# Patient Record
Sex: Female | Born: 1960 | Race: Black or African American | Hispanic: No | Marital: Married | Smoking: Never smoker
Health system: Southern US, Community
[De-identification: ages and names within clinical notes are randomized; demographics above are authoritative.]

## PROBLEM LIST (undated history)

## (undated) DIAGNOSIS — D649 Anemia, unspecified: Secondary | ICD-10-CM

## (undated) DIAGNOSIS — M1990 Unspecified osteoarthritis, unspecified site: Secondary | ICD-10-CM

## (undated) DIAGNOSIS — J302 Other seasonal allergic rhinitis: Secondary | ICD-10-CM

## (undated) DIAGNOSIS — E669 Obesity, unspecified: Secondary | ICD-10-CM

## (undated) HISTORY — DX: Unspecified osteoarthritis, unspecified site: M19.90

## (undated) HISTORY — DX: Anemia, unspecified: D64.9

## (undated) HISTORY — PX: OTHER SURGICAL HISTORY: SHX169

## (undated) HISTORY — DX: Obesity, unspecified: E66.9

---

## 2000-04-26 ENCOUNTER — Emergency Department (HOSPITAL_COMMUNITY): Admission: EM | Admit: 2000-04-26 | Discharge: 2000-04-26 | Payer: Self-pay | Admitting: *Deleted

## 2000-04-26 ENCOUNTER — Encounter: Payer: Self-pay | Admitting: *Deleted

## 2000-05-04 ENCOUNTER — Ambulatory Visit (HOSPITAL_COMMUNITY): Admission: RE | Admit: 2000-05-04 | Discharge: 2000-05-04 | Payer: Self-pay | Admitting: Orthopedic Surgery

## 2000-05-04 ENCOUNTER — Encounter: Payer: Self-pay | Admitting: Orthopedic Surgery

## 2000-07-19 ENCOUNTER — Encounter (HOSPITAL_COMMUNITY): Admission: RE | Admit: 2000-07-19 | Discharge: 2000-08-10 | Payer: Self-pay | Admitting: Orthopedic Surgery

## 2003-02-28 ENCOUNTER — Emergency Department (HOSPITAL_COMMUNITY): Admission: EM | Admit: 2003-02-28 | Discharge: 2003-02-28 | Payer: Self-pay | Admitting: Emergency Medicine

## 2004-01-16 ENCOUNTER — Ambulatory Visit (HOSPITAL_COMMUNITY): Admission: RE | Admit: 2004-01-16 | Discharge: 2004-01-16 | Payer: Self-pay | Admitting: Family Medicine

## 2004-01-27 ENCOUNTER — Ambulatory Visit (HOSPITAL_COMMUNITY): Admission: RE | Admit: 2004-01-27 | Discharge: 2004-01-27 | Payer: Self-pay | Admitting: Family Medicine

## 2004-04-28 ENCOUNTER — Ambulatory Visit (HOSPITAL_COMMUNITY): Admission: RE | Admit: 2004-04-28 | Discharge: 2004-04-28 | Payer: Self-pay | Admitting: Family Medicine

## 2005-05-04 ENCOUNTER — Encounter (HOSPITAL_COMMUNITY): Admission: RE | Admit: 2005-05-04 | Discharge: 2005-06-03 | Payer: Self-pay | Admitting: Oncology

## 2005-05-04 ENCOUNTER — Encounter: Admission: RE | Admit: 2005-05-04 | Discharge: 2005-05-04 | Payer: Self-pay | Admitting: Oncology

## 2005-05-04 ENCOUNTER — Ambulatory Visit (HOSPITAL_COMMUNITY): Payer: Self-pay | Admitting: Oncology

## 2005-05-21 ENCOUNTER — Emergency Department (HOSPITAL_COMMUNITY): Admission: EM | Admit: 2005-05-21 | Discharge: 2005-05-21 | Payer: Self-pay | Admitting: Emergency Medicine

## 2005-06-14 ENCOUNTER — Encounter: Admission: RE | Admit: 2005-06-14 | Discharge: 2005-06-14 | Payer: Self-pay | Admitting: Oncology

## 2005-06-14 ENCOUNTER — Encounter (HOSPITAL_COMMUNITY): Admission: RE | Admit: 2005-06-14 | Discharge: 2005-07-14 | Payer: Self-pay | Admitting: Oncology

## 2005-06-16 ENCOUNTER — Ambulatory Visit (HOSPITAL_COMMUNITY): Admission: RE | Admit: 2005-06-16 | Discharge: 2005-06-16 | Payer: Self-pay | Admitting: Family Medicine

## 2005-06-30 ENCOUNTER — Ambulatory Visit (HOSPITAL_COMMUNITY): Admission: RE | Admit: 2005-06-30 | Discharge: 2005-06-30 | Payer: Self-pay | Admitting: Family Medicine

## 2006-02-02 ENCOUNTER — Ambulatory Visit (HOSPITAL_COMMUNITY): Admission: RE | Admit: 2006-02-02 | Discharge: 2006-02-02 | Payer: Self-pay | Admitting: Family Medicine

## 2008-05-27 ENCOUNTER — Emergency Department (HOSPITAL_COMMUNITY): Admission: EM | Admit: 2008-05-27 | Discharge: 2008-05-27 | Payer: Self-pay | Admitting: Emergency Medicine

## 2010-03-03 LAB — COMPREHENSIVE METABOLIC PANEL
ALT: 28 U/L (ref 7–35)
AST: 28 U/L
Alkaline Phosphatase: 61 U/L
BUN: 9 mg/dL (ref 4–21)
Potassium: 4.5 mmol/L
Sodium: 139 mmol/L (ref 137–147)
Total Bilirubin: 0.4 mg/dL

## 2010-03-03 LAB — CBC WITH DIFFERENTIAL/PLATELET
HCT: 37 %
Hemoglobin: 11 g/dL — AB (ref 12.0–16.0)
MCV: 73.5 fL
WBC: 7

## 2010-09-29 ENCOUNTER — Telehealth: Payer: Self-pay

## 2010-09-29 NOTE — Telephone Encounter (Signed)
LMOM for pt to call. 

## 2010-10-01 ENCOUNTER — Ambulatory Visit: Payer: Self-pay | Admitting: Urgent Care

## 2010-10-01 NOTE — Telephone Encounter (Signed)
Pt is scheduled for OV with Tana Coast at 9:00 AM on 10/05/2010.

## 2010-10-05 ENCOUNTER — Encounter: Payer: Self-pay | Admitting: Gastroenterology

## 2010-10-05 ENCOUNTER — Ambulatory Visit (INDEPENDENT_AMBULATORY_CARE_PROVIDER_SITE_OTHER): Payer: BC Managed Care – PPO | Admitting: Gastroenterology

## 2010-10-05 DIAGNOSIS — D649 Anemia, unspecified: Secondary | ICD-10-CM

## 2010-10-05 DIAGNOSIS — Z1211 Encounter for screening for malignant neoplasm of colon: Secondary | ICD-10-CM | POA: Insufficient documentation

## 2010-10-05 NOTE — Progress Notes (Signed)
Primary Care Physician:  Kirk Ruths, MD  Primary Gastroenterologist:  [name], MD   Chief Complaint  Patient presents with  . Colonoscopy    HPI:  Amy Parrish is a 50 y.o. female here to schedule colonoscopy. She gives a history of anemia previously seen by Dr. Glenford Peers. States she tried to give blood several times per hemoglobin was too low. States she was given "treatment" and that does seem to make a difference. She states she was told that it was just her "normal". She denies any blood in the stool or melena. Regular menses. Denies constipation, diarrhea, abdominal pain, heartburn, dysphagia, I doubt aphasia, weight loss. He intermittently takes over-the-counter NSAIDs for headache but nothing on a regular basis. She has never had a colonoscopy. No family members with history of colon polyps or colon cancer. She had a sister who died at age 43 of unknown etiology. Formal autopsy was not done. The family thought it may be cancer but they're not sure.  No current outpatient prescriptions on file.    Allergies as of 10/05/2010  . (No Known Allergies)    Past Medical History  Diagnosis Date  . Anemia     seen by Dr. Mariel Sleet    Past Surgical History  Procedure Date  . Left hand surgery     Family History  Problem Relation Age of Onset  . Colon cancer Neg Hx   . Hypertension Father   . Hypertension Mother   . Kidney failure Mother     early stages    History   Social History  . Marital Status: Single    Spouse Name: N/A    Number of Children: 5  . Years of Education: N/A   Occupational History  . school     nutrition   Social History Main Topics  . Smoking status: Never Smoker   . Smokeless tobacco: Not on file  . Alcohol Use: No  . Drug Use: No  . Sexually Active: Not on file   Other Topics Concern  . Not on file   Social History Narrative   1 biological children4 adopted children      ROS:  General: Negative for anorexia,  weight loss, fever, chills, fatigue, weakness. Eyes: Negative for vision changes.  ENT: Negative for hoarseness, difficulty swallowing , nasal congestion. CV: Negative for chest pain, angina, palpitations, dyspnea on exertion, peripheral edema.  Respiratory: Negative for dyspnea at rest, dyspnea on exertion, cough, sputum, wheezing.  GI: See history of present illness. GU:  Negative for dysuria, hematuria, urinary incontinence, urinary frequency, nocturnal urination.  MS: Negative for joint pain, low back pain.  Derm: Negative for rash or itching.  Neuro: Negative for weakness, abnormal sensation, seizure, frequent headaches, memory loss, confusion.  Psych: Negative for anxiety, depression, suicidal ideation, hallucinations.  Endo: Negative for unusual weight change.  Heme: Negative for bruising or bleeding. Allergy: Negative for rash or hives.    Physical Examination:  BP 121/69  Pulse 71  Temp(Src) 97.5 F (36.4 C) (Temporal)  Ht 5\' 3"  (1.6 m)  Wt 183 lb 12.8 oz (83.371 kg)  BMI 32.56 kg/m2  LMP 10/04/2010   General: Well-nourished, well-developed in no acute distress. Obese. Head: Normocephalic, atraumatic.   Eyes: Conjunctiva pink, no icterus. Mouth: Oropharyngeal mucosa moist and pink , no lesions erythema or exudate. Neck: Supple without thyromegaly, masses, or lymphadenopathy.  Lungs: Clear to auscultation bilaterally.  Heart: Regular rate and rhythm, no murmurs rubs or gallops.  Abdomen:  Bowel sounds are normal, nontender, nondistended, no hepatosplenomegaly or masses, no abdominal bruits or    hernia , no rebound or guarding.   Rectal: Deferred to time of colonoscopy. Extremities: No lower extremity edema. No clubbing or deformities.  Neuro: Alert and oriented x 4 , grossly normal neurologically.  Skin: Warm and dry, no rash or jaundice.   Psych: Alert and cooperative, normal mood and affect.

## 2010-10-05 NOTE — Assessment & Plan Note (Signed)
No prior colonoscopy. Due for average risk screening. History of anemia but further details unavailable at this time.  I have discussed the risks, alternatives, benefits with regards to but not limited to the risk of reaction to medication, bleeding, infection, perforation and the patient is agreeable to proceed. Written consent to be obtained.

## 2010-10-05 NOTE — Assessment & Plan Note (Signed)
Patient has history of anemia. Will retrieve records from Dr. Mariel Sleet and [facility] for review.

## 2010-10-05 NOTE — Progress Notes (Signed)
Cc to PCP 

## 2010-10-06 ENCOUNTER — Encounter: Payer: Self-pay | Admitting: Gastroenterology

## 2010-10-06 NOTE — Progress Notes (Signed)
Received records from Dr. Thornton Papas office. Patient seen back in 2007. 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 Hgb in 11 range with MCV in low 70s.

## 2010-10-15 MED ORDER — SODIUM CHLORIDE 0.45 % IV SOLN
Freq: Once | INTRAVENOUS | Status: AC
  Administered 2010-10-16: 11:00:00 via INTRAVENOUS

## 2010-10-16 ENCOUNTER — Other Ambulatory Visit: Payer: Self-pay | Admitting: Gastroenterology

## 2010-10-16 ENCOUNTER — Ambulatory Visit (HOSPITAL_COMMUNITY)
Admission: RE | Admit: 2010-10-16 | Discharge: 2010-10-16 | Disposition: A | Discharge status: Home / Self Care | Payer: BC Managed Care – PPO | Source: Ambulatory Visit | Attending: Gastroenterology | Admitting: Gastroenterology

## 2010-10-16 ENCOUNTER — Encounter (HOSPITAL_COMMUNITY): Payer: Self-pay | Admitting: *Deleted

## 2010-10-16 ENCOUNTER — Encounter (HOSPITAL_COMMUNITY)
Admission: RE | Disposition: A | Discharge status: Home / Self Care | Payer: Self-pay | Source: Ambulatory Visit | Attending: Gastroenterology

## 2010-10-16 DIAGNOSIS — D128 Benign neoplasm of rectum: Secondary | ICD-10-CM | POA: Insufficient documentation

## 2010-10-16 DIAGNOSIS — K5730 Diverticulosis of large intestine without perforation or abscess without bleeding: Secondary | ICD-10-CM

## 2010-10-16 DIAGNOSIS — K648 Other hemorrhoids: Secondary | ICD-10-CM

## 2010-10-16 DIAGNOSIS — Z1211 Encounter for screening for malignant neoplasm of colon: Secondary | ICD-10-CM

## 2010-10-16 DIAGNOSIS — D129 Benign neoplasm of anus and anal canal: Secondary | ICD-10-CM

## 2010-10-16 HISTORY — DX: Other seasonal allergic rhinitis: J30.2

## 2010-10-16 HISTORY — PX: COLONOSCOPY: SHX5424

## 2010-10-16 SURGERY — COLONOSCOPY
Anesthesia: Moderate Sedation

## 2010-10-16 MED ORDER — MIDAZOLAM HCL 5 MG/5ML IJ SOLN
INTRAMUSCULAR | Status: DC | PRN
  Administered 2010-10-16: 1 mg via INTRAVENOUS
  Administered 2010-10-16: 2 mg via INTRAVENOUS
  Administered 2010-10-16: 1 mg via INTRAVENOUS

## 2010-10-16 MED ORDER — MEPERIDINE HCL 100 MG/ML IJ SOLN
INTRAMUSCULAR | Status: AC
  Filled 2010-10-16: qty 2

## 2010-10-16 MED ORDER — STERILE WATER FOR IRRIGATION IR SOLN
Status: DC | PRN
  Administered 2010-10-16: 13:00:00

## 2010-10-16 MED ORDER — MIDAZOLAM HCL 5 MG/5ML IJ SOLN
INTRAMUSCULAR | Status: AC
  Filled 2010-10-16: qty 10

## 2010-10-16 MED ORDER — MEPERIDINE HCL 100 MG/ML IJ SOLN
INTRAMUSCULAR | Status: DC | PRN
  Administered 2010-10-16: 25 mg via INTRAVENOUS
  Administered 2010-10-16: 50 mg via INTRAVENOUS
  Administered 2010-10-16: 25 mg via INTRAVENOUS

## 2010-10-16 NOTE — H&P (Signed)
Reason for Visit     Colonoscopy        Vitals - Last Recorded       BP Pulse Temp(Src) Ht Wt BMI    121/69  71  97.5 F (36.4 C) (Temporal)  5\' 3"  (1.6 m)  183 lb 12.8 oz (83.371 kg)  32.56 kg/m2          LMP              10/04/2010                 Vitals History Recorded          Progress Notes     Amy Coast, PA  10/05/2010 10:26 AM  Signed Primary Care Physician:  Amy Ruths, MD   Primary Gastroenterologist:  Amy Eva, MD      Chief Complaint   Patient presents with   .  Colonoscopy      HPI:  Amy Parrish is a 50 y.o. female here to schedule colonoscopy. She gives a history of anemia previously seen by Amy Parrish. States she tried to give blood several times per hemoglobin was too low. States she was given "treatment" and that does seem to make a difference. She states she was told that it was just her "normal". She denies any blood in the stool or melena. Regular menses. Denies constipation, diarrhea, abdominal pain, heartburn, dysphagia, I doubt aphasia, weight loss. He intermittently takes over-the-counter NSAIDs for headache but nothing on a regular basis. She has never had a colonoscopy. No family members with history of colon polyps or colon cancer. She had a sister who died at age 52 of unknown etiology. Formal autopsy was not done. The family thought it may be cancer but they're not sure.    No current outpatient prescriptions on file.       Allergies as of 10/05/2010   .  (No Known Allergies)       Past Medical History   Diagnosis  Date   .  Anemia         seen by Amy Parrish       Past Surgical History   Procedure  Date   .  Left hand surgery         Family History   Problem  Relation  Age of Onset   .  Colon cancer  Neg Hx     .  Hypertension  Father     .  Hypertension  Mother     .  Kidney failure  Mother         early stages       History       Social History   .  Marital Status:  Single    Spouse Name:  N/A       Number of Children:  5   .  Years of Education:  N/A       Occupational History   .  school         nutrition       Social History Main Topics   .  Smoking status:  Never Smoker    .  Smokeless tobacco:  Not on file   .  Alcohol Use:  No   .  Drug Use:  No   .  Sexually Active:  Not on file       Other Topics  Concern   .  Not on file  Social History Narrative     1 biological children4 adopted children        ROS:   General: Negative for anorexia, weight loss, fever, chills, fatigue, weakness. Eyes: Negative for vision changes.   ENT: Negative for hoarseness, difficulty swallowing , nasal congestion. CV: Negative for chest pain, angina, palpitations, dyspnea on exertion, peripheral edema.   Respiratory: Negative for dyspnea at rest, dyspnea on exertion, cough, sputum, wheezing.   GI: See history of present illness. GU:  Negative for dysuria, hematuria, urinary incontinence, urinary frequency, nocturnal urination.   MS: Negative for joint pain, low back pain.   Derm: Negative for rash or itching.   Neuro: Negative for weakness, abnormal sensation, seizure, frequent headaches, memory loss, confusion.   Psych: Negative for anxiety, depression, suicidal ideation, hallucinations.   Endo: Negative for unusual weight change.   Heme: Negative for bruising or bleeding. Allergy: Negative for rash or hives.     Physical Examination:   BP 121/69  Pulse 71  Temp(Src) 97.5 F (36.4 C) (Temporal)  Ht 5\' 3"  (1.6 m)  Wt 183 lb 12.8 oz (83.371 kg)  BMI 32.56 kg/m2  LMP 10/04/2010    General: Well-nourished, well-developed in no acute distress. Obese. Head: Normocephalic, atraumatic.    Eyes: Conjunctiva pink, no icterus. Mouth: Oropharyngeal mucosa moist and pink , no lesions erythema or exudate. Neck: Supple without thyromegaly, masses, or lymphadenopathy.   Lungs: Clear to auscultation bilaterally.   Heart: Regular rate and rhythm, no  murmurs rubs or gallops.   Abdomen: Bowel sounds are normal, nontender, nondistended, no hepatosplenomegaly or masses, no abdominal bruits or    hernia , no rebound or guarding.    Rectal: Deferred to time of colonoscopy. Extremities: No lower extremity edema. No clubbing or deformities.   Neuro: Alert and oriented x 4 , grossly normal neurologically.   Skin: Warm and dry, no rash or jaundice.    Psych: Alert and cooperative, normal mood and affect.    Amy Parrish  10/05/2010  4:19 PM  Signed Cc to PCP  Amy Coast, PA  10/06/2010  2:37 PM  Signed Received records from Dr. Thornton Parrish office. Patient seen back in 2007. 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 Hgb in 11 range with MCV in low 70s.          Anemia - Amy Coast, PA  10/05/2010  9:36 AM  Signed Patient has history of anemia. Will retrieve records from Amy Parrish and [facility] for review.  Colon cancer screening - Amy Coast, PA  10/05/2010  9:36 AM  Signed No prior colonoscopy. Due for average risk screening. History of anemia but further details unavailable at this time.  I have discussed the risks, alternatives, benefits with regards to but not limited to the risk of reaction to medication, bleeding, infection, perforation and the patient is agreeable to proceed. Written consent to be obtained.

## 2010-10-16 NOTE — Interval H&P Note (Signed)
History and Physical Interval Note:   10/16/2010   12:58 PM   Amy Parrish  has presented today for surgery, with the diagnosis of hx of anemia  The various methods of treatment have been discussed with the patient and family. After consideration of risks, benefits and other options for treatment, the patient has consented to  Procedure(s): COLONOSCOPY as a surgical intervention .  I have reviewed the patients' chart and labs.  Questions were answered to the patient's satisfaction.     [name]  MD

## 2010-10-21 ENCOUNTER — Telehealth: Payer: Self-pay | Admitting: Gastroenterology

## 2010-10-21 NOTE — Telephone Encounter (Signed)
Please call pt. 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 TCS in 10 years. High fiber diet.

## 2010-10-21 NOTE — Telephone Encounter (Signed)
Reminder in epic to have tcs in 10 years 

## 2010-10-22 NOTE — Telephone Encounter (Signed)
LMOM to call.

## 2010-10-22 NOTE — Telephone Encounter (Signed)
Cc Results to PCP 

## 2010-10-26 ENCOUNTER — Encounter (HOSPITAL_COMMUNITY): Payer: Self-pay | Admitting: Gastroenterology

## 2010-10-27 NOTE — Telephone Encounter (Signed)
LMOM to call.

## 2010-10-29 NOTE — Telephone Encounter (Signed)
LMOM to call.

## 2010-10-29 NOTE — Telephone Encounter (Signed)
Letter mailed to pt to call.  

## 2010-11-03 NOTE — Progress Notes (Signed)
Pt called to get results of biopsy from 10/21/2010. Informed of all.

## 2010-12-07 NOTE — Progress Notes (Signed)
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

## 2010-12-16 ENCOUNTER — Encounter (HOSPITAL_COMMUNITY): Payer: Self-pay | Admitting: Emergency Medicine

## 2010-12-16 ENCOUNTER — Emergency Department (HOSPITAL_COMMUNITY)
Admission: EM | Admit: 2010-12-16 | Discharge: 2010-12-17 | Disposition: A | Discharge status: Home / Self Care | Payer: BC Managed Care – PPO | Attending: Emergency Medicine | Admitting: Emergency Medicine

## 2010-12-16 DIAGNOSIS — Z9181 History of falling: Secondary | ICD-10-CM | POA: Insufficient documentation

## 2010-12-16 DIAGNOSIS — M79609 Pain in unspecified limb: Secondary | ICD-10-CM | POA: Insufficient documentation

## 2010-12-16 DIAGNOSIS — M79673 Pain in unspecified foot: Secondary | ICD-10-CM

## 2010-12-16 DIAGNOSIS — D563 Thalassemia minor: Secondary | ICD-10-CM | POA: Insufficient documentation

## 2010-12-16 DIAGNOSIS — Z862 Personal history of diseases of the blood and blood-forming organs and certain disorders involving the immune mechanism: Secondary | ICD-10-CM | POA: Insufficient documentation

## 2010-12-16 NOTE — ED Notes (Signed)
Patient states she fell at work on 11/23/2010 and hurt her foot. States continues to have pain in right foot since fall; states has been unable to get in touch with her employee health.

## 2010-12-17 ENCOUNTER — Emergency Department (HOSPITAL_COMMUNITY): Payer: BC Managed Care – PPO

## 2010-12-17 MED ORDER — OXYCODONE-ACETAMINOPHEN 5-325 MG PO TABS
2.0000 | ORAL_TABLET | Freq: Once | ORAL | Status: AC
  Administered 2010-12-17: 2 via ORAL
  Filled 2010-12-17: qty 2

## 2010-12-17 MED ORDER — HYDROCODONE-ACETAMINOPHEN 5-325 MG PO TABS: 2.0000 | ORAL_TABLET | ORAL | Status: AC | PRN

## 2010-12-17 NOTE — ED Provider Notes (Signed)
History     CSN: [id number] Arrival date & time: 12/16/2010 11:50 PM   First MD Initiated Contact with Patient 12/17/10 0153      Chief Complaint  Patient presents with  . Foot Pain    (Consider location/radiation/quality/duration/timing/severity/associated sxs/prior treatment) HPI Comments: Patient follow work November 12 and injured her right lateral foot. She's had continued pain since the fall and is unable to follow up with occupational health. She denies any new injury, weakness, numbness or tingling. Denies any fever or vomiting. He says the x-ray to November and was negative for fracture. She still has pain over her right lateral foot that radiates up into her ankle. She was taking Naprosyn for pain but ran out.  The history is provided by the patient.    Past Medical History  Diagnosis Date  . Anemia     seen by Dr. Mariel Sleet, records alpha thalassemia trait  . Seasonal allergies     Past Surgical History  Procedure Date  . Left hand surgery   . Colonoscopy 10/16/2010    Procedure: COLONOSCOPY;  Surgeon: Arlyce Harman, MD;  Location: [facility];  Service: Endoscopy;  Laterality: N/A;  11:30    Family History  Problem Relation Age of Onset  . Colon cancer Neg Hx   . Hypertension Father   . Hypertension Mother   . Kidney failure Mother     early stages    History  Substance Use Topics  . Smoking status: Never Smoker   . Smokeless tobacco: Not on file  . Alcohol Use: No    OB History    Grav Para Term Preterm Abortions TAB SAB Ect Mult Living                  Review of Systems  All other systems reviewed and are negative.    Allergies  Bee venom  Home Medications   Current Outpatient Rx  Name Route Sig Dispense Refill  . NAPROXEN 500 MG PO TABS Oral Take 500 mg by mouth 2 (two) times daily.      Marland Kitchen EPINEPHRINE 0.3 MG/0.3ML IJ DEVI Intramuscular Inject 0.3 mg into the muscle once.      Marland Kitchen HYDROCODONE-ACETAMINOPHEN 5-325 MG PO TABS Oral Take 2  tablets by mouth every 4 (four) hours as needed for pain. 10 tablet 0    BP 134/63  Pulse 65  Temp(Src) 98 F (36.7 C) (Oral)  Resp 20  Ht 5\' 3"  (1.6 m)  Wt 175 lb (79.379 kg)  BMI 31.00 kg/m2  SpO2 98%  LMP 12/16/2010  Physical Exam  Constitutional: She is oriented to person, place, and time. She appears well-developed and well-nourished. No distress.  HENT:  Head: Normocephalic and atraumatic.  Mouth/Throat: Oropharynx is clear and moist.  Eyes: Conjunctivae are normal.  Neck: Normal range of motion.  Cardiovascular: Normal rate, regular rhythm and normal heart sounds.   Pulmonary/Chest: Effort normal and breath sounds normal. No respiratory distress.  Abdominal: Soft. There is no tenderness. There is no rebound and no guarding.  Musculoskeletal: She exhibits tenderness.       Tenderness to palpation over the right lateral foot. No specific pain at the lateral malleolus or base of fifth metatarsal. 2 DP and PT pulses able to wiggle toes  Neurological: She is alert and oriented to person, place, and time.  Skin: Skin is warm.    ED Course  Procedures (including critical care time)  Labs Reviewed - No data to display Dg  Foot Complete Right  12/17/2010  *RADIOLOGY REPORT*  Clinical Data: Status post fall; injury to right foot, with persistent right foot pain.  RIGHT FOOT COMPLETE - 3+ VIEW  Comparison: Right foot radiographs performed 11/27/2010  Findings: There is no evidence of fracture or dislocation.  The joint spaces are preserved.  There is no evidence of talar subluxation; the subtalar joint is unremarkable in appearance. Mild flattening and subcortical cyst formation along the medial aspect of the first metatarsal head are stable in appearance. Plantar and posterior calcaneal spurs are incidentally noted.  No significant soft tissue abnormalities are seen.  IMPRESSION:  1.  No evidence of fracture or dislocation. 2.  Stable mild degenerative change at the medial aspect of  the first metatarsal head.  Original Report Authenticated By: Tonia Ghent, M.D.     1. Foot pain       MDM   foot pain after remote fall.  Neurovascularly intact without any new injury.  We'll treat pain, immobilize and followup with occupational health.       Glynn Octave, MD 12/17/10 [phone]

## 2010-12-17 NOTE — ED Notes (Signed)
Pt reports falling while at work on November 23, 2010 and sprained my ankle. Pt states "I was told my case worker went out on leave and there's nobody to follow my case. My ankle is still hurting me and it's not getting any better. I can walk on it but it's really painful." Pt denies new injury.

## 2010-12-28 ENCOUNTER — Other Ambulatory Visit (HOSPITAL_COMMUNITY): Payer: Self-pay | Admitting: Family Medicine

## 2010-12-28 ENCOUNTER — Other Ambulatory Visit (HOSPITAL_COMMUNITY): Payer: Self-pay | Admitting: *Deleted

## 2010-12-28 DIAGNOSIS — T148XXA Other injury of unspecified body region, initial encounter: Secondary | ICD-10-CM

## 2011-01-04 ENCOUNTER — Encounter (HOSPITAL_COMMUNITY)
Admission: RE | Admit: 2011-01-04 | Discharge: 2011-01-04 | Disposition: A | Discharge status: Home / Self Care | Payer: BC Managed Care – PPO | Source: Ambulatory Visit | Attending: Family Medicine | Admitting: Family Medicine

## 2011-01-04 DIAGNOSIS — T148XXA Other injury of unspecified body region, initial encounter: Secondary | ICD-10-CM | POA: Insufficient documentation

## 2011-01-04 DIAGNOSIS — M79609 Pain in unspecified limb: Secondary | ICD-10-CM | POA: Insufficient documentation

## 2011-01-04 DIAGNOSIS — X58XXXA Exposure to other specified factors, initial encounter: Secondary | ICD-10-CM | POA: Insufficient documentation

## 2011-01-04 MED ORDER — TECHNETIUM TC 99M MEDRONATE IV KIT
23.1000 | PACK | Freq: Once | INTRAVENOUS | Status: AC | PRN
  Administered 2011-01-04: 23.1 via INTRAVENOUS

## 2012-05-18 ENCOUNTER — Other Ambulatory Visit (HOSPITAL_COMMUNITY): Payer: Self-pay | Admitting: Physician Assistant

## 2012-05-18 ENCOUNTER — Ambulatory Visit (HOSPITAL_COMMUNITY)
Admission: RE | Admit: 2012-05-18 | Discharge: 2012-05-18 | Disposition: A | Discharge status: Home / Self Care | Payer: BC Managed Care – PPO | Source: Ambulatory Visit | Attending: Physician Assistant | Admitting: Physician Assistant

## 2012-05-18 DIAGNOSIS — Z0000 Encounter for general adult medical examination without abnormal findings: Secondary | ICD-10-CM

## 2012-05-18 DIAGNOSIS — M25552 Pain in left hip: Secondary | ICD-10-CM

## 2012-05-18 DIAGNOSIS — M25559 Pain in unspecified hip: Secondary | ICD-10-CM | POA: Insufficient documentation

## 2012-05-29 ENCOUNTER — Ambulatory Visit (HOSPITAL_COMMUNITY)
Admission: RE | Admit: 2012-05-29 | Discharge: 2012-05-29 | Disposition: A | Discharge status: Home / Self Care | Payer: BC Managed Care – PPO | Source: Ambulatory Visit | Attending: Physician Assistant | Admitting: Physician Assistant

## 2012-05-29 DIAGNOSIS — Z0000 Encounter for general adult medical examination without abnormal findings: Secondary | ICD-10-CM

## 2012-05-29 DIAGNOSIS — Z1231 Encounter for screening mammogram for malignant neoplasm of breast: Secondary | ICD-10-CM | POA: Insufficient documentation

## 2012-06-01 ENCOUNTER — Other Ambulatory Visit (HOSPITAL_COMMUNITY): Payer: Self-pay | Admitting: Physician Assistant

## 2012-06-01 DIAGNOSIS — Z09 Encounter for follow-up examination after completed treatment for conditions other than malignant neoplasm: Secondary | ICD-10-CM

## 2012-06-14 ENCOUNTER — Ambulatory Visit (HOSPITAL_COMMUNITY)
Admission: RE | Admit: 2012-06-14 | Discharge: 2012-06-14 | Disposition: A | Discharge status: Home / Self Care | Payer: BC Managed Care – PPO | Source: Ambulatory Visit | Attending: Physician Assistant | Admitting: Physician Assistant

## 2012-06-14 DIAGNOSIS — R928 Other abnormal and inconclusive findings on diagnostic imaging of breast: Secondary | ICD-10-CM | POA: Insufficient documentation

## 2012-06-14 DIAGNOSIS — Z09 Encounter for follow-up examination after completed treatment for conditions other than malignant neoplasm: Secondary | ICD-10-CM

## 2017-06-05 ENCOUNTER — Emergency Department (HOSPITAL_COMMUNITY): Payer: BC Managed Care – PPO

## 2017-06-05 ENCOUNTER — Encounter (HOSPITAL_COMMUNITY): Payer: Self-pay

## 2017-06-05 ENCOUNTER — Other Ambulatory Visit: Payer: Self-pay

## 2017-06-05 ENCOUNTER — Emergency Department (HOSPITAL_COMMUNITY)
Admission: EM | Admit: 2017-06-05 | Discharge: 2017-06-05 | Disposition: A | Discharge status: Home / Self Care | Payer: BC Managed Care – PPO | Source: Home / Self Care | Attending: Emergency Medicine | Admitting: Emergency Medicine

## 2017-06-05 DIAGNOSIS — R109 Unspecified abdominal pain: Secondary | ICD-10-CM

## 2017-06-05 DIAGNOSIS — K819 Cholecystitis, unspecified: Secondary | ICD-10-CM | POA: Diagnosis not present

## 2017-06-05 DIAGNOSIS — K8000 Calculus of gallbladder with acute cholecystitis without obstruction: Secondary | ICD-10-CM | POA: Diagnosis not present

## 2017-06-05 DIAGNOSIS — R112 Nausea with vomiting, unspecified: Secondary | ICD-10-CM | POA: Insufficient documentation

## 2017-06-05 LAB — CBC
HCT: 37.8 % (ref 36.0–46.0)
Hemoglobin: 11.4 g/dL — ABNORMAL LOW (ref 12.0–15.0)
MCH: 22.1 pg — AB (ref 26.0–34.0)
MCHC: 30.2 g/dL (ref 30.0–36.0)
MCV: 73.4 fL — AB (ref 78.0–100.0)
PLATELETS: 347 10*3/uL (ref 150–400)
RBC: 5.15 MIL/uL — ABNORMAL HIGH (ref 3.87–5.11)
RDW: 13.5 % (ref 11.5–15.5)
WBC: 7.9 10*3/uL (ref 4.0–10.5)

## 2017-06-05 LAB — COMPREHENSIVE METABOLIC PANEL
ALT: 23 U/L (ref 14–54)
AST: 23 U/L (ref 15–41)
Albumin: 4.6 g/dL (ref 3.5–5.0)
Alkaline Phosphatase: 84 U/L (ref 38–126)
Anion gap: 9 (ref 5–15)
BUN: 17 mg/dL (ref 6–20)
CHLORIDE: 103 mmol/L (ref 101–111)
CO2: 25 mmol/L (ref 22–32)
CREATININE: 0.7 mg/dL (ref 0.44–1.00)
Calcium: 9.1 mg/dL (ref 8.9–10.3)
GFR calc Af Amer: >60 mL/min (ref >60)
GFR calc non Af Amer: >60 mL/min (ref >60)
GLUCOSE: 127 mg/dL — AB (ref 65–99)
Potassium: 3.6 mmol/L (ref 3.5–5.1)
SODIUM: 137 mmol/L (ref 135–145)
Total Bilirubin: 0.6 mg/dL (ref 0.3–1.2)
Total Protein: 8.3 g/dL — ABNORMAL HIGH (ref 6.5–8.1)

## 2017-06-05 LAB — URINALYSIS, ROUTINE W REFLEX MICROSCOPIC
BILIRUBIN URINE: NEGATIVE
Glucose, UA: NEGATIVE mg/dL
Ketones, ur: NEGATIVE mg/dL
Leukocytes, UA: NEGATIVE
Nitrite: NEGATIVE
Protein, ur: NEGATIVE mg/dL
SPECIFIC GRAVITY, URINE: 1.018 (ref 1.005–1.030)
pH: 7 (ref 5.0–8.0)

## 2017-06-05 LAB — LIPASE, BLOOD: LIPASE: 30 U/L (ref 11–51)

## 2017-06-05 MED ORDER — ONDANSETRON 4 MG PO TBDP: 4.0000 mg | ORAL_TABLET | Freq: Three times a day (TID) | ORAL | 0 refills | Status: DC | PRN

## 2017-06-05 MED ORDER — SODIUM CHLORIDE 0.9 % IV BOLUS
1000.0000 mL | Freq: Once | INTRAVENOUS | Status: AC
  Administered 2017-06-05: 1000 mL via INTRAVENOUS

## 2017-06-05 MED ORDER — IOPAMIDOL (ISOVUE-300) INJECTION 61%
100.0000 mL | Freq: Once | INTRAVENOUS | Status: AC | PRN
  Administered 2017-06-05: 100 mL via INTRAVENOUS

## 2017-06-05 MED ORDER — ONDANSETRON 4 MG PO TBDP
4.0000 mg | ORAL_TABLET | Freq: Once | ORAL | Status: DC | PRN
  Filled 2017-06-05: qty 1

## 2017-06-05 MED ORDER — ONDANSETRON HCL 4 MG/2ML IJ SOLN
INTRAMUSCULAR | Status: AC
  Administered 2017-06-05: 4 mg via INTRAVENOUS
  Filled 2017-06-05: qty 2

## 2017-06-05 MED ORDER — FENTANYL CITRATE (PF) 100 MCG/2ML IJ SOLN
50.0000 ug | Freq: Once | INTRAMUSCULAR | Status: AC
  Administered 2017-06-05: 50 ug via INTRAVENOUS
  Filled 2017-06-05: qty 2

## 2017-06-05 MED ORDER — KETOROLAC TROMETHAMINE 30 MG/ML IJ SOLN
30.0000 mg | Freq: Once | INTRAMUSCULAR | Status: AC
  Administered 2017-06-05: 30 mg via INTRAVENOUS
  Filled 2017-06-05: qty 1

## 2017-06-05 MED ORDER — ONDANSETRON HCL 4 MG/2ML IJ SOLN
4.0000 mg | Freq: Once | INTRAMUSCULAR | Status: AC | PRN
  Administered 2017-06-05: 4 mg via INTRAVENOUS

## 2017-06-05 NOTE — ED Notes (Signed)
Patient transported to CT 

## 2017-06-05 NOTE — ED Notes (Signed)
EDP at bedside  

## 2017-06-05 NOTE — ED Triage Notes (Signed)
Pt reports abdominal pain that radiates through to back. Pt also reports N/V, all symptoms started this morning.

## 2017-06-05 NOTE — ED Notes (Signed)
Pt returned from CT. Pt reports pain is increasing.

## 2017-06-05 NOTE — ED Notes (Signed)
EDP at bedside updating patient and family. 

## 2017-06-05 NOTE — ED Provider Notes (Signed)
[facility] EMERGENCY DEPARTMENT Provider Note   CSN: [id number] Arrival date & time: 06/05/17  0550     History   Chief Complaint Chief Complaint  Patient presents with  . Abdominal Pain    N/V    HPI Amy Parrish is a 57 y.o. female.  HPI  The tp is a 57 y/o female -[place]entz to the hospital today with a complaint of abdominal pain.  This started at approximately 4:00 in the morning, when she woke up it went to her bilateral sides and back and has been very uncomfortable and colicky in nature.  She has had multiple episodes of vomiting with this and states that she vomited and dry heaves so hard she eventually saw a small amount of blood.  She denies any fevers chills diarrhea constipation or blood in her stools.  She is accompanied by her significant other who states that they ate similar food yesterday except for a turnip salad which she ate alone.  She has never had abdominal surgery, never had ovarian cyst, has never had cesarean sections or bowel obstructions.  She denies any significant obstruction or distention of her abdomen.  She has had no medication prior to arrival  Past Medical History:  Diagnosis Date  . Anemia    seen by Dr. Tressie Stalker, records alpha thalassemia trait  . Seasonal allergies     Patient Active Problem List   Diagnosis Date Noted  . Anemia 10/05/2010  . Colon cancer screening 10/05/2010    Past Surgical History:  Procedure Laterality Date  . COLONOSCOPY  10/16/2010   Procedure: COLONOSCOPY;  Surgeon: Dorothyann Peng, MD;  [facility]: [facility];  Service: Endoscopy;  Laterality: N/A;  11:30  . left hand surgery       OB History   None      Home Medications    Prior to Admission medications   Medication Sig Start Date End Date Taking? Authorizing Provider  EPINEPHrine (EPIPEN) 0.3 mg/0.3 mL DEVI Inject 0.3 mg into the muscle once.      [provider]  ondansetron (ZOFRAN ODT) 4 MG disintegrating tablet Take 1 tablet (4 mg  total) by mouth every 8 (eight) hours as needed for nausea. 06/05/17   Noemi Chapel, MD    Family History Family History  Problem Relation Age of Onset  . Hypertension Father   . Hypertension Mother   . Kidney failure Mother        early stages  . Colon cancer Neg Hx     Social History Social History   Tobacco Use  . Smoking status: Never Smoker  . Smokeless tobacco: Never Used  Substance Use Topics  . Alcohol use: No  . Drug use: No     Allergies   Bee venom   Review of Systems Review of Systems  All other systems reviewed and are negative.    Physical Exam Updated Vital Signs BP (!) 152/77   Pulse 71   Temp 98 F (36.7 C) (Oral)   Resp 20   Ht 5\' 2"  (1.575 m)   Wt 81.6 kg (180 lb)   LMP 11/19/2011   SpO2 92%   BMI 32.92 kg/m   Physical Exam  Constitutional: She appears well-developed and well-nourished. No distress.  The patient is not distressed, she is appearing uncomfortable and somewhat colicky on the bed she cannot get a comfortable position  HENT:  Head: Normocephalic and atraumatic.  Mouth/Throat: Oropharynx is clear and moist. No oropharyngeal exudate.  Eyes: Pupils are equal, round, and reactive to light. Conjunctivae and EOM are normal. Right eye exhibits no discharge. Left eye exhibits no discharge. No scleral icterus.  Neck: Normal range of motion. Neck supple. No JVD present. No thyromegaly present.  Cardiovascular: Normal rate, regular rhythm, normal heart sounds and intact distal pulses. Exam reveals no gallop and no friction rub.  No murmur heard. Pulmonary/Chest: Effort normal and breath sounds normal. No respiratory distress. She has no wheezes. She has no rales.  Abdominal: Soft. Bowel sounds are normal. She exhibits no distension and no mass. There is no tenderness.  The abdomen is obese but extremely soft and nontender diffusely  Musculoskeletal: Normal range of motion. She exhibits no edema or tenderness.  Lymphadenopathy:    She  has no cervical adenopathy.  Neurological: She is alert. Coordination normal.  Skin: Skin is warm and dry. No rash noted. No erythema.  Psychiatric: She has a normal mood and affect. Her behavior is normal.  Nursing note and vitals reviewed.    ED Treatments / Results  Labs (all labs ordered are listed, but only abnormal results are displayed) Labs Reviewed  COMPREHENSIVE METABOLIC PANEL - Abnormal; Notable for the following components:      Result Value   Glucose, Bld 127 (*)    Total Protein 8.3 (*)    All other components within normal limits  CBC - Abnormal; Notable for the following components:   RBC 5.15 (*)    Hemoglobin 11.4 (*)    MCV 73.4 (*)    MCH 22.1 (*)    All other components within normal limits  URINALYSIS, ROUTINE W REFLEX MICROSCOPIC - Abnormal; Notable for the following components:   APPearance CLOUDY (*)    Hgb urine dipstick SMALL (*)    Bacteria, UA RARE (*)    All other components within normal limits  LIPASE, BLOOD    EKG None  Radiology Ct Abdomen Pelvis W Contrast  Result Date: 06/05/2017 CLINICAL DATA:  Acute generalized abdominal pain, nausea, vomiting. EXAM: CT ABDOMEN AND PELVIS WITH CONTRAST TECHNIQUE: Multidetector CT imaging of the abdomen and pelvis was performed using the standard protocol following bolus administration of intravenous contrast. CONTRAST:  164mL ISOVUE-300 IOPAMIDOL (ISOVUE-300) INJECTION 61% COMPARISON:  None. FINDINGS: Lower chest: No acute abnormality. Hepatobiliary: Cholelithiasis without inflammation. Liver appears normal. No biliary dilatation is noted. Pancreas: Unremarkable. No pancreatic ductal dilatation or surrounding inflammatory changes. Spleen: Normal in size without focal abnormality. Adrenals/Urinary Tract: Adrenal glands appear normal. Small right renal cyst is noted. No hydronephrosis or renal obstruction is noted. No renal or ureteral calculi are noted. Urinary bladder is unremarkable. Stomach/Bowel: Stomach  is within normal limits. Appendix appears normal. No evidence of bowel wall thickening, distention, or inflammatory changes. Vascular/Lymphatic: No significant vascular findings are present. No enlarged abdominal or pelvic lymph nodes. Reproductive: Intrauterine device is noted. No adnexal abnormality is noted. Other: No abdominal wall hernia or abnormality. No abdominopelvic ascites. Musculoskeletal: No acute or significant osseous findings. IMPRESSION: Cholelithiasis. No other abnormality seen in the abdomen or pelvis. Electronically Signed   By: Marijo Conception, M.D.   On: 06/05/2017 08:49    Procedures Procedures (including critical care time)  Medications Ordered in ED Medications  ondansetron (ZOFRAN-ODT) disintegrating tablet 4 mg (4 mg Oral Not Given 06/05/17 0628)  ondansetron (ZOFRAN) injection 4 mg (4 mg Intravenous Given 06/05/17 0641)  sodium chloride 0.9 % bolus 1,000 mL (0 mLs Intravenous Stopped 06/05/17 0859)  fentaNYL (SUBLIMAZE) injection 50 mcg (50  mcg Intravenous Given 06/05/17 0722)  iopamidol (ISOVUE-300) 61 % injection 100 mL (100 mLs Intravenous Contrast Given 06/05/17 0816)  ketorolac (TORADOL) 30 MG/ML injection 30 mg (30 mg Intravenous Given 06/05/17 0849)     Initial Impression / Assessment and Plan / ED Course  I have reviewed the triage vital signs and the nursing notes.  Pertinent labs & imaging results that were available during my care of the patient were reviewed by me and considered in my medical decision making (see chart for details).    Labs reviewed, white blood cell count is normal at 7900 with no significant anemia (she does have a history of anemia requiring iron transfusions in the past).  She was given Zofran, I will add some fluids and fentanyl pending other labs.  She may need a CT scan to look for kidney stones or other sources of her significant acute onset of symptoms.  He is not in atrial fibrillation and does not have pain out of proportion to  exam  CT scan and labs are unremarkable including the urinalysis lipase metabolic panel and CBC.  CT scan shows no acute findings other than some gallstones.  Ultrasound arranged for the morning.  Patient agreeable to the plan and well-appearing at discharge  Final Clinical Impressions(s) / ED Diagnoses   Final diagnoses:  Non-intractable vomiting with nausea, unspecified vomiting type  Abdominal pain, unspecified abdominal location    ED Discharge Orders        Ordered    ondansetron (ZOFRAN ODT) 4 MG disintegrating tablet  Every 8 hours PRN     06/05/17 1030    US Abdomen Limited RUQ/Gall Gladder     06/05/17 1032       Noemi Chapel, MD 06/05/17 [phone]

## 2017-06-05 NOTE — Discharge Instructions (Addendum)
Your testing including your x-rays and blood work was normal, there was no signs of significant abnormalities other than the presence of some gallstones which at this time do not appear to be causing problems Please take Zofran as needed for nausea Clear liquid diet for the next 24 hours Return to the emergency department for severe or worsening pain or vomiting. I have arranged for you to have an ultrasound tomorrow morning.  Please return to the emergency department to check in for your ultrasound.  You do not need to register as a patient.  You will need to call the phone number on the paperwork to arrange the time for tomorrow morning.

## 2017-06-06 ENCOUNTER — Inpatient Hospital Stay (HOSPITAL_COMMUNITY)
Admission: EM | Admit: 2017-06-06 | Discharge: 2017-06-08 | DRG: 419 | Disposition: A | Discharge status: Home / Self Care | Payer: BC Managed Care – PPO | Attending: Family Medicine | Admitting: Family Medicine

## 2017-06-06 ENCOUNTER — Other Ambulatory Visit: Payer: Self-pay

## 2017-06-06 ENCOUNTER — Inpatient Hospital Stay (HOSPITAL_COMMUNITY): Payer: BC Managed Care – PPO

## 2017-06-06 ENCOUNTER — Encounter (HOSPITAL_COMMUNITY): Payer: Self-pay | Admitting: *Deleted

## 2017-06-06 DIAGNOSIS — Z91030 Bee allergy status: Secondary | ICD-10-CM | POA: Diagnosis not present

## 2017-06-06 DIAGNOSIS — K8020 Calculus of gallbladder without cholecystitis without obstruction: Secondary | ICD-10-CM | POA: Diagnosis not present

## 2017-06-06 DIAGNOSIS — D563 Thalassemia minor: Secondary | ICD-10-CM | POA: Diagnosis present

## 2017-06-06 DIAGNOSIS — K8000 Calculus of gallbladder with acute cholecystitis without obstruction: Secondary | ICD-10-CM | POA: Diagnosis present

## 2017-06-06 DIAGNOSIS — K819 Cholecystitis, unspecified: Secondary | ICD-10-CM | POA: Diagnosis present

## 2017-06-06 DIAGNOSIS — D649 Anemia, unspecified: Secondary | ICD-10-CM | POA: Diagnosis present

## 2017-06-06 LAB — COMPREHENSIVE METABOLIC PANEL
ALBUMIN: 4.5 g/dL (ref 3.5–5.0)
ALK PHOS: 70 U/L (ref 38–126)
ALT: 24 U/L (ref 14–54)
AST: 25 U/L (ref 15–41)
Anion gap: 10 (ref 5–15)
BUN: 9 mg/dL (ref 6–20)
CO2: 26 mmol/L (ref 22–32)
Calcium: 9.5 mg/dL (ref 8.9–10.3)
Chloride: 103 mmol/L (ref 101–111)
Creatinine, Ser: 0.79 mg/dL (ref 0.44–1.00)
GFR calc Af Amer: >60 mL/min (ref >60)
GFR calc non Af Amer: >60 mL/min (ref >60)
Glucose, Bld: 121 mg/dL — ABNORMAL HIGH (ref 65–99)
Potassium: 3.7 mmol/L (ref 3.5–5.1)
Sodium: 139 mmol/L (ref 135–145)
TOTAL PROTEIN: 8.1 g/dL (ref 6.5–8.1)
Total Bilirubin: 1.4 mg/dL — ABNORMAL HIGH (ref 0.3–1.2)

## 2017-06-06 LAB — CBC WITH DIFFERENTIAL/PLATELET
BASOS ABS: 0 10*3/uL (ref 0.0–0.1)
BASOS PCT: 0 %
Eosinophils Absolute: 0.1 10*3/uL (ref 0.0–0.7)
Eosinophils Relative: 1 %
HCT: 37.1 % (ref 36.0–46.0)
HEMOGLOBIN: 11.4 g/dL — AB (ref 12.0–15.0)
Lymphocytes Relative: 14 %
Lymphs Abs: 1.5 10*3/uL (ref 0.7–4.0)
MCH: 22.3 pg — ABNORMAL LOW (ref 26.0–34.0)
MCHC: 30.7 g/dL (ref 30.0–36.0)
MCV: 72.5 fL — ABNORMAL LOW (ref 78.0–100.0)
Monocytes Absolute: 0.5 10*3/uL (ref 0.1–1.0)
Monocytes Relative: 4 %
NEUTROS ABS: 8.8 10*3/uL — AB (ref 1.7–7.7)
NEUTROS PCT: 81 %
Platelets: 364 10*3/uL (ref 150–400)
RBC: 5.12 MIL/uL — ABNORMAL HIGH (ref 3.87–5.11)
RDW: 13.6 % (ref 11.5–15.5)
WBC: 10.9 10*3/uL — AB (ref 4.0–10.5)

## 2017-06-06 LAB — LIPASE, BLOOD: LIPASE: 25 U/L (ref 11–51)

## 2017-06-06 LAB — SURGICAL PCR SCREEN
MRSA, PCR: NEGATIVE
STAPHYLOCOCCUS AUREUS: NEGATIVE

## 2017-06-06 MED ORDER — ONDANSETRON HCL 4 MG PO TABS: 4.0000 mg | ORAL_TABLET | Freq: Four times a day (QID) | ORAL | Status: DC | PRN

## 2017-06-06 MED ORDER — ENOXAPARIN SODIUM 40 MG/0.4ML ~~LOC~~ SOLN
40.0000 mg | Freq: Once | SUBCUTANEOUS | Status: AC
  Administered 2017-06-07: 40 mg via SUBCUTANEOUS
  Filled 2017-06-06: qty 0.4

## 2017-06-06 MED ORDER — ONDANSETRON HCL 4 MG/2ML IJ SOLN: 4.0000 mg | Freq: Four times a day (QID) | INTRAMUSCULAR | Status: DC | PRN

## 2017-06-06 MED ORDER — HYDROMORPHONE HCL 1 MG/ML IJ SOLN
1.0000 mg | INTRAMUSCULAR | Status: DC | PRN
  Administered 2017-06-06 – 2017-06-07 (×6): 1 mg via INTRAVENOUS
  Filled 2017-06-06 (×6): qty 1

## 2017-06-06 MED ORDER — LACTATED RINGERS IV SOLN
INTRAVENOUS | Status: AC
  Administered 2017-06-06 (×2): via INTRAVENOUS

## 2017-06-06 MED ORDER — FENTANYL CITRATE (PF) 100 MCG/2ML IJ SOLN
50.0000 ug | Freq: Once | INTRAMUSCULAR | Status: AC
  Administered 2017-06-06: 50 ug via INTRAVENOUS
  Filled 2017-06-06: qty 2

## 2017-06-06 MED ORDER — MUPIROCIN 2 % EX OINT: 1.0000 "application " | TOPICAL_OINTMENT | Freq: Two times a day (BID) | CUTANEOUS | Status: DC

## 2017-06-06 MED ORDER — ACETAMINOPHEN 650 MG RE SUPP: 650.0000 mg | Freq: Four times a day (QID) | RECTAL | Status: DC | PRN

## 2017-06-06 MED ORDER — CHLORHEXIDINE GLUCONATE CLOTH 2 % EX PADS
6.0000 | MEDICATED_PAD | Freq: Once | CUTANEOUS | Status: AC
  Administered 2017-06-07: 6 via TOPICAL

## 2017-06-06 MED ORDER — FENTANYL CITRATE (PF) 100 MCG/2ML IJ SOLN: 50.0000 ug | Freq: Once | INTRAMUSCULAR | Status: DC

## 2017-06-06 MED ORDER — SODIUM CHLORIDE 0.9 % IV BOLUS
1000.0000 mL | Freq: Once | INTRAVENOUS | Status: AC
  Administered 2017-06-06: 1000 mL via INTRAVENOUS

## 2017-06-06 MED ORDER — SODIUM CHLORIDE 0.9 % IV SOLN
2.0000 g | Freq: Once | INTRAVENOUS | Status: AC
  Administered 2017-06-06: 2 g via INTRAVENOUS
  Filled 2017-06-06: qty 20

## 2017-06-06 MED ORDER — ACETAMINOPHEN 325 MG PO TABS: 650.0000 mg | ORAL_TABLET | Freq: Four times a day (QID) | ORAL | Status: DC | PRN

## 2017-06-06 MED ORDER — SODIUM CHLORIDE 0.9 % IV BOLUS
500.0000 mL | Freq: Once | INTRAVENOUS | Status: AC
  Administered 2017-06-06: 500 mL via INTRAVENOUS

## 2017-06-06 MED ORDER — ENOXAPARIN SODIUM 40 MG/0.4ML ~~LOC~~ SOLN: 40.0000 mg | SUBCUTANEOUS | Status: DC

## 2017-06-06 MED ORDER — CIPROFLOXACIN IN D5W 400 MG/200ML IV SOLN
400.0000 mg | INTRAVENOUS | Status: AC
  Administered 2017-06-07: 400 mg via INTRAVENOUS
  Filled 2017-06-06: qty 200

## 2017-06-06 MED ORDER — CHLORHEXIDINE GLUCONATE CLOTH 2 % EX PADS
6.0000 | MEDICATED_PAD | Freq: Once | CUTANEOUS | Status: AC
  Administered 2017-06-06: 6 via TOPICAL

## 2017-06-06 MED ORDER — ONDANSETRON HCL 4 MG/2ML IJ SOLN
4.0000 mg | Freq: Once | INTRAMUSCULAR | Status: AC
  Administered 2017-06-06: 4 mg via INTRAVENOUS
  Filled 2017-06-06: qty 2

## 2017-06-06 NOTE — H&P (Addendum)
History and Physical    Amy Parrish XNA:[id number] DOB: November 17, 1960 DOA: 06/06/2017  PCP: Redmond School, MD   Patient coming from: Home  Chief Complaint: Upper abdominal pain   HPI: Amy Parrish is a 57 y.o. female with medical history significant for anemia with alpha thalassemia who presented to the emergency department earlier on the morning of 5/26 when she was awakened with bilateral upper abdominal pain with radiation to her bilateral back.  She described it as a constant and aching sensation with no aggravating or alleviating factors.  She had some nausea and vomiting and was seen in the ED with essentially normal lab work and CT abdomen demonstrating cholelithiasis.  She was discharged home with plans for ultrasound work-up the following day, but when she returned home she continued to have further nausea and vomiting and some worsening pain.  She states that her pain is mostly in the upper epigastric region. She denies any fever, chills, diarrhea, constipation, or dysuria. Of note, she has no history of prior surgery.   ED Course: Vital signs are stable.  Laboratory data reveals WBC count of 10,900.  Her hemoglobin is 11.4 and microcytic, but she has history of alpha thalassemia trait.  CT of the abdomen and pelvis with contrast performed earlier demonstrates presence of cholelithiasis and no other acute findings.  ED physician has spoken with Dr. Arnoldo Morale who requests hospitalist to admit with ultrasound of the abdomen in a.m.  Review of Systems: All others reviewed and otherwise negative.  Past Medical History:  Diagnosis Date  . Anemia    seen by Dr. Tressie Stalker, records alpha thalassemia trait  . Seasonal allergies     Past Surgical History:  Procedure Laterality Date  . COLONOSCOPY  10/16/2010   Procedure: COLONOSCOPY;  Surgeon: Dorothyann Peng, MD;  Location: [facility];  Service: Endoscopy;  Laterality: N/A;  11:30  . left hand surgery       reports that she has  never smoked. She has never used smokeless tobacco. She reports that she does not drink alcohol or use drugs.  Allergies  Allergen Reactions  . Bee Venom     Family History  Problem Relation Age of Onset  . Hypertension Father   . Hypertension Mother   . Kidney failure Mother        early stages  . Colon cancer Neg Hx     Prior to Admission medications   Medication Sig Start Date End Date Taking? Authorizing Provider  EPINEPHrine (EPIPEN) 0.3 mg/0.3 mL DEVI Inject 0.3 mg into the muscle once.      [provider]  ondansetron (ZOFRAN ODT) 4 MG disintegrating tablet Take 1 tablet (4 mg total) by mouth every 8 (eight) hours as needed for nausea. 06/05/17   Noemi Chapel, MD    Physical Exam: Vitals:   06/06/17 0219 06/06/17 0230 06/06/17 0300 06/06/17 0330  BP: 127/72 (!) 146/80 (!) 147/84 124/66  Pulse: 61 62 62 70  Resp: 18     Temp:      TempSrc:      SpO2: 94% 95% 100% 97%  Weight:      Height:        Constitutional: NAD, calm, comfortable Vitals:   06/06/17 0219 06/06/17 0230 06/06/17 0300 06/06/17 0330  BP: 127/72 (!) 146/80 (!) 147/84 124/66  Pulse: 61 62 62 70  Resp: 18     Temp:      TempSrc:      SpO2: 94% 95%  100% 97%  Weight:      Height:       Eyes: lids and conjunctivae normal ENMT: Mucous membranes are moist.  Neck: normal, supple Respiratory: clear to auscultation bilaterally. Normal respiratory effort. No accessory muscle use.  Cardiovascular: Regular rate and rhythm, no murmurs. No extremity edema. Abdomen: Tenderness to palpation of her right upper quadrant, no distention. Bowel sounds positive.  No guarding rigidity or rebound. Musculoskeletal:  No joint deformity upper and lower extremities.   Skin: no rashes, lesions, ulcers.  Psychiatric: Normal judgment and insight. Alert and oriented x 3. Normal mood.   Labs on Admission: I have personally reviewed following labs and imaging studies  CBC: Recent Labs  Lab 06/05/17 0642  06/06/17 0149  WBC 7.9 10.9*  NEUTROABS  --  8.8*  HGB 11.4* 11.4*  HCT 37.8 37.1  MCV 73.4* 72.5*  PLT 347 532   Basic Metabolic Panel: Recent Labs  Lab 06/05/17 0642 06/06/17 0149  NA 137 139  K 3.6 3.7  CL 103 103  CO2 25 26  GLUCOSE 127* 121*  BUN 17 9  CREATININE 0.70 0.79  CALCIUM 9.1 9.5   GFR: Estimated Creatinine Clearance: 75.9 mL/min (by C-G formula based on SCr of 0.79 mg/dL). Liver Function Tests: Recent Labs  Lab 06/05/17 0642 06/06/17 0149  AST 23 25  ALT 23 24  ALKPHOS 84 70  BILITOT 0.6 1.4*  PROT 8.3* 8.1  ALBUMIN 4.6 4.5   Recent Labs  Lab 06/05/17 0642 06/06/17 0149  LIPASE 30 25   No results for input(s): AMMONIA in the last 168 hours. Coagulation Profile: No results for input(s): INR, PROTIME in the last 168 hours. Cardiac Enzymes: No results for input(s): CKTOTAL, CKMB, CKMBINDEX, TROPONINI in the last 168 hours. BNP (last 3 results) No results for input(s): PROBNP in the last 8760 hours. HbA1C: No results for input(s): HGBA1C in the last 72 hours. CBG: No results for input(s): GLUCAP in the last 168 hours. Lipid Profile: No results for input(s): CHOL, HDL, LDLCALC, TRIG, CHOLHDL, LDLDIRECT in the last 72 hours. Thyroid Function Tests: No results for input(s): TSH, T4TOTAL, FREET4, T3FREE, THYROIDAB in the last 72 hours. Anemia Panel: No results for input(s): VITAMINB12, FOLATE, FERRITIN, TIBC, IRON, RETICCTPCT in the last 72 hours. Urine analysis:    Component Value Date/Time   COLORURINE YELLOW 06/05/2017 0719   APPEARANCEUR CLOUDY (A) 06/05/2017 0719   LABSPEC 1.018 06/05/2017 0719   PHURINE 7.0 06/05/2017 0719   GLUCOSEU NEGATIVE 06/05/2017 0719   HGBUR SMALL (A) 06/05/2017 0719   BILIRUBINUR NEGATIVE 06/05/2017 0719   KETONESUR NEGATIVE 06/05/2017 0719   PROTEINUR NEGATIVE 06/05/2017 0719   NITRITE NEGATIVE 06/05/2017 0719   LEUKOCYTESUR NEGATIVE 06/05/2017 0719    Radiological Exams on Admission: Ct Abdomen  Pelvis W Contrast  Result Date: 06/05/2017 CLINICAL DATA:  Acute generalized abdominal pain, nausea, vomiting. EXAM: CT ABDOMEN AND PELVIS WITH CONTRAST TECHNIQUE: Multidetector CT imaging of the abdomen and pelvis was performed using the standard protocol following bolus administration of intravenous contrast. CONTRAST:  177mL ISOVUE-300 IOPAMIDOL (ISOVUE-300) INJECTION 61% COMPARISON:  None. FINDINGS: Lower chest: No acute abnormality. Hepatobiliary: Cholelithiasis without inflammation. Liver appears normal. No biliary dilatation is noted. Pancreas: Unremarkable. No pancreatic ductal dilatation or surrounding inflammatory changes. Spleen: Normal in size without focal abnormality. Adrenals/Urinary Tract: Adrenal glands appear normal. Small right renal cyst is noted. No hydronephrosis or renal obstruction is noted. No renal or ureteral calculi are noted. Urinary bladder is unremarkable. Stomach/Bowel: Stomach is  within normal limits. Appendix appears normal. No evidence of bowel wall thickening, distention, or inflammatory changes. Vascular/Lymphatic: No significant vascular findings are present. No enlarged abdominal or pelvic lymph nodes. Reproductive: Intrauterine device is noted. No adnexal abnormality is noted. Other: No abdominal wall hernia or abnormality. No abdominopelvic ascites. Musculoskeletal: No acute or significant osseous findings. IMPRESSION: Cholelithiasis. No other abnormality seen in the abdomen or pelvis. Electronically Signed   By: Marijo Conception, M.D.   On: 06/05/2017 08:49    Assessment/Plan Principal Problem:   Cholelithiasis Active Problems:   Anemia    1. Cholelithiasis with suspicion of acute cholecystitis.  Appreciate general surgery evaluation with possible need for cholecystectomy.  Maintain on IV fluid and otherwise n.p.o.  Zofran as needed for nausea and vomiting as well as IV Dilaudid for pain management. 2. Anemia with alpha thalassemia trait.  Continue to monitor  with repeat CBC.   DVT prophylaxis: SCDs Code Status: Full Family Communication: Husband at bedside Disposition Plan:GS evaluation for cholecystectomy Consults called:[facility] Admission status: Inpatient, MedSurg   Pratik Darleen Crocker DO [facility] Pager [phone]  If 7PM-7AM, please contact night-coverage [email or web address] Password [facility]  06/06/2017, 3:59 AM

## 2017-06-06 NOTE — ED Provider Notes (Signed)
[facility] EMERGENCY DEPARTMENT Provider Note   CSN: [id number] Arrival date & time: 06/06/17  0003  Time seen 01:00 AM   History   Chief Complaint Chief Complaint  Patient presents with  . Abdominal Pain    HPI Amy Parrish is a 57 y.o. female.  HPI patient states at 4 AM on May 26 she was awakened with bilateral upper abdominal pain that radiates around into her bilateral back.  She describes it as constant and aching.  She had vomiting at that time.  She was seen in the ED yesterday morning and they were told her CT and lab work were all normal however she was supposed to call in the morning to get a ultrasound done.  They state when she got home she vomited once and then they got her Zofran  prescription filled and she has not had vomiting since.  She has been doing a clear diet which did not make her pain worse.  Nothing makes her pain worse, nothing makes it feel better.  She denies diarrhea, constipation, dysuria, frequency, or fever.  She states she is never had this pain before.  She states that nothing was different in her diet before this started.  She has no prior history of surgery.  She states her daughter has had to have her gallbladder removed.  PCP Redmond School, MD   Past Medical History:  Diagnosis Date  . Anemia    seen by Dr. Tressie Stalker, records alpha thalassemia trait  . Seasonal allergies     Patient Active Problem List   Diagnosis Date Noted  . Anemia 10/05/2010  . Colon cancer screening 10/05/2010    Past Surgical History:  Procedure Laterality Date  . COLONOSCOPY  10/16/2010   Procedure: COLONOSCOPY;  Surgeon: Dorothyann Peng, MD;  Location: [facility];  Service: Endoscopy;  Laterality: N/A;  11:30  . left hand surgery       OB History   None      Home Medications    Prior to Admission medications   Medication Sig Start Date End Date Taking? Authorizing Provider  EPINEPHrine (EPIPEN) 0.3 mg/0.3 mL DEVI Inject 0.3 mg into the muscle  once.      [provider]  ondansetron (ZOFRAN ODT) 4 MG disintegrating tablet Take 1 tablet (4 mg total) by mouth every 8 (eight) hours as needed for nausea. 06/05/17   Noemi Chapel, MD    Family History Family History  Problem Relation Age of Onset  . Hypertension Father   . Hypertension Mother   . Kidney failure Mother        early stages  . Colon cancer Neg Hx     Social History Social History   Tobacco Use  . Smoking status: Never Smoker  . Smokeless tobacco: Never Used  Substance Use Topics  . Alcohol use: No  . Drug use: No     Allergies   Bee venom   Review of Systems Review of Systems  All other systems reviewed and are negative.    Physical Exam Updated Vital Signs BP (!) 144/77 (BP Location: Right Arm)   Pulse 67   Temp 98.2 F (36.8 C) (Oral)   Resp 20   Ht 5\' 2"  (1.575 m)   Wt 81.6 kg (180 lb)   LMP 11/19/2011   SpO2 100%   BMI 32.92 kg/m   Vital signs normal    Physical Exam  Constitutional: She is oriented to person, place, and time.  She appears well-developed and well-nourished.  Non-toxic appearance. She does not appear ill. No distress.  HENT:  Head: Normocephalic and atraumatic.  Right Ear: External ear normal.  Left Ear: External ear normal.  Nose: Nose normal. No mucosal edema or rhinorrhea.  Mouth/Throat: Oropharynx is clear and moist and mucous membranes are normal. No dental abscesses or uvula swelling.  Eyes: Pupils are equal, round, and reactive to light. Conjunctivae and EOM are normal.  Neck: Normal range of motion and full passive range of motion without pain. Neck supple.  Cardiovascular: Normal rate, regular rhythm and normal heart sounds. Exam reveals no gallop and no friction rub.  No murmur heard. Pulmonary/Chest: Effort normal and breath sounds normal. No respiratory distress. She has no wheezes. She has no rhonchi. She has no rales. She exhibits no tenderness and no crepitus.  Abdominal: Soft. Normal  appearance and bowel sounds are normal. She exhibits no distension. There is tenderness in the right upper quadrant. There is no rebound and no guarding.    Although patient is mildly tender diffusely she is especially tender in the right upper quadrant.  Musculoskeletal: Normal range of motion. She exhibits no edema or tenderness.  Moves all extremities well.   Neurological: She is alert and oriented to person, place, and time. She has normal strength. No cranial nerve deficit.  Skin: Skin is warm, dry and intact. No rash noted. No erythema. No pallor.  Patient feels hot to touch  Psychiatric: She has a normal mood and affect. Her speech is normal and behavior is normal. Her mood appears not anxious.  Nursing note and vitals reviewed.    ED Treatments / Results  Labs (all labs ordered are listed, but only abnormal results are displayed) Results for orders placed or performed during the hospital encounter of 06/06/17  Comprehensive metabolic panel  Result Value Ref Range   Sodium 139 135 - 145 mmol/L   Potassium 3.7 3.5 - 5.1 mmol/L   Chloride 103 101 - 111 mmol/L   CO2 26 22 - 32 mmol/L   Glucose, Bld 121 (H) 65 - 99 mg/dL   BUN 9 6 - 20 mg/dL   Creatinine, Ser 0.79 0.44 - 1.00 mg/dL   Calcium 9.5 8.9 - 10.3 mg/dL   Total Protein 8.1 6.5 - 8.1 g/dL   Albumin 4.5 3.5 - 5.0 g/dL   AST 25 15 - 41 U/L   ALT 24 14 - 54 U/L   Alkaline Phosphatase 70 38 - 126 U/L   Total Bilirubin 1.4 (H) 0.3 - 1.2 mg/dL   GFR calc non Af Amer >60 >60 mL/min   GFR calc Af Amer >60 >60 mL/min   Anion gap 10 5 - 15  Lipase, blood  Result Value Ref Range   Lipase 25 11 - 51 U/L  CBC with Differential  Result Value Ref Range   WBC 10.9 (H) 4.0 - 10.5 K/uL   RBC 5.12 (H) 3.87 - 5.11 MIL/uL   Hemoglobin 11.4 (L) 12.0 - 15.0 g/dL   HCT 37.1 36.0 - 46.0 %   MCV 72.5 (L) 78.0 - 100.0 fL   MCH 22.3 (L) 26.0 - 34.0 pg   MCHC 30.7 30.0 - 36.0 g/dL   RDW 13.6 11.5 - 15.5 %   Platelets 364 150 - 400  K/uL   Neutrophils Relative % 81 %   Neutro Abs 8.8 (H) 1.7 - 7.7 K/uL   Lymphocytes Relative 14 %   Lymphs Abs 1.5 0.7 - 4.0 K/uL  Monocytes Relative 4 %   Monocytes Absolute 0.5 0.1 - 1.0 K/uL   Eosinophils Relative 1 %   Eosinophils Absolute 0.1 0.0 - 0.7 K/uL   Basophils Relative 0 %   Basophils Absolute 0.0 0.0 - 0.1 K/uL   Laboratory interpretation all normal except her white blood cell count now is minimally elevated, her total bilirubin now has become minimally elevated compared to this morning.    EKG None  Radiology Ct Abdomen Pelvis W Contrast  Result Date: 06/05/2017 CLINICAL DATA:  Acute generalized abdominal pain, nausea, vomiting. EXAM: CT ABDOMEN AND PELVIS WITH CONTRAST TECHNIQUE: Multidetector CT imaging of the abdomen and pelvis was performed using the standard protocol following bolus administration of intravenous contrast. CONTRAST:  163mL ISOVUE-300 IOPAMIDOL (ISOVUE-300) INJECTION 61% COMPARISON:  None. FINDINGS: Lower chest: No acute abnormality. Hepatobiliary: Cholelithiasis without inflammation. Liver appears normal. No biliary dilatation is noted. Pancreas: Unremarkable. No pancreatic ductal dilatation or surrounding inflammatory changes. Spleen: Normal in size without focal abnormality. Adrenals/Urinary Tract: Adrenal glands appear normal. Small right renal cyst is noted. No hydronephrosis or renal obstruction is noted. No renal or ureteral calculi are noted. Urinary bladder is unremarkable. Stomach/Bowel: Stomach is within normal limits. Appendix appears normal. No evidence of bowel wall thickening, distention, or inflammatory changes. Vascular/Lymphatic: No significant vascular findings are present. No enlarged abdominal or pelvic lymph nodes. Reproductive: Intrauterine device is noted. No adnexal abnormality is noted. Other: No abdominal wall hernia or abnormality. No abdominopelvic ascites. Musculoskeletal: No acute or significant osseous findings. IMPRESSION:  Cholelithiasis. No other abnormality seen in the abdomen or pelvis. Electronically Signed   By: Marijo Conception, M.D.   On: 06/05/2017 08:49    Procedures Procedures (including critical care time)  Medications Ordered in ED Medications  sodium chloride 0.9 % bolus 1,000 mL (0 mLs Intravenous Stopped 06/06/17 0249)  sodium chloride 0.9 % bolus 500 mL (0 mLs Intravenous Stopped 06/06/17 0249)  fentaNYL (SUBLIMAZE) injection 50 mcg (50 mcg Intravenous Given 06/06/17 0145)  ondansetron (ZOFRAN) injection 4 mg (4 mg Intravenous Given 06/06/17 0144)  cefTRIAXone (ROCEPHIN) 2 g in sodium chloride 0.9 % 100 mL IVPB (0 g Intravenous Stopped 06/06/17 0219)  fentaNYL (SUBLIMAZE) injection 50 mcg (50 mcg Intravenous Given 06/06/17 0302)     Initial Impression / Assessment and Plan / ED Course  I have reviewed the triage vital signs and the nursing notes.  Pertinent labs & imaging results that were available during my care of the patient were reviewed by me and considered in my medical decision making (see chart for details).    Patient exam is concerning for cholecystitis with the history of gallstones on her CT scan and worsening pain.  Laboratory testing was done, she was given IV fluids, IV pain and nausea medication and she was started on Rocephin 2 g for acute cholecystitis.  Unfortunately ultrasound is not available tonight.  02:51 AM Dr Arnoldo Morale, surgery, have medicine admit, will see in AM, get [place] in AM  03:14 AM Dr Manuella Ghazi, hospitalist will admit  Patient has been made aware of her being admitted and they are agreeable.  Final Clinical Impressions(s) / ED Diagnoses   Final diagnoses:  Cholecystitis    Plan admission  Rolland Porter, MD, Barbette Or, MD 06/06/17 [phone]

## 2017-06-06 NOTE — H&P (View-Only) (Signed)
Reason for Consult: Right upper quadrant abdominal pain, cholelithiasis Referring Physician: Dr. Elyse Jarvis T Ravert is an 57 y.o. female.  HPI: Patient is a 57 year old white female with a history of alpha thalassemia who presented to the emergency room with worsening right upper quadrant abdominal pain.  She was seen in the emergency room twice as she initially went home but returned back to the emergency room with worsening nausea and pain.  She states this is the first episode of pain.  She denies any fever or chills.  Ultrasound gallbladder reveals cholelithiasis with pericholecystic fluid, mildly thickened wall, and normal common bile duct.  She denies any history of jaundice.  She currently has pain of 2 out of 10.  She did receive pain medication.  Past Medical History:  Diagnosis Date  . Anemia    seen by Dr. Tressie Stalker, records alpha thalassemia trait  . Seasonal allergies     Past Surgical History:  Procedure Laterality Date  . COLONOSCOPY  10/16/2010   Procedure: COLONOSCOPY;  Surgeon: Dorothyann Peng, MD;  Location: [facility];  Service: Endoscopy;  Laterality: N/A;  11:30  . left hand surgery      Family History  Problem Relation Age of Onset  . Hypertension Father   . Hypertension Mother   . Kidney failure Mother        early stages  . Colon cancer Neg Hx     Social History:  reports that she has never smoked. She has never used smokeless tobacco. She reports that she does not drink alcohol or use drugs.  Allergies:  Allergies  Allergen Reactions  . Bee Venom     Medications: I have reviewed the patient's current medications.  Results for orders placed or performed during the hospital encounter of 06/06/17 (from the past 48 hour(s))  Comprehensive metabolic panel     Status: Abnormal   Collection Time: 06/06/17  1:49 AM  Result Value Ref Range   Sodium 139 135 - 145 mmol/L   Potassium 3.7 3.5 - 5.1 mmol/L   Chloride 103 101 - 111 mmol/L   CO2 26 22 -  32 mmol/L   Glucose, Bld 121 (H) 65 - 99 mg/dL   BUN 9 6 - 20 mg/dL   Creatinine, Ser 0.79 0.44 - 1.00 mg/dL   Calcium 9.5 8.9 - 10.3 mg/dL   Total Protein 8.1 6.5 - 8.1 g/dL   Albumin 4.5 3.5 - 5.0 g/dL   AST 25 15 - 41 U/L   ALT 24 14 - 54 U/L   Alkaline Phosphatase 70 38 - 126 U/L   Total Bilirubin 1.4 (H) 0.3 - 1.2 mg/dL   GFR calc non Af Amer >60 >60 mL/min   GFR calc Af Amer >60 >60 mL/min    Comment: (NOTE) The eGFR has been calculated using the CKD EPI equation. This calculation has not been validated in all clinical situations. eGFR's persistently <60 mL/min signify possible Chronic Kidney Disease.    Anion gap 10 5 - 15    Comment: Performed at [facility], [address], [place], [place] [address]  Lipase, blood     Status: None   Collection Time: 06/06/17  1:49 AM  Result Value Ref Range   Lipase 25 11 - 51 U/L    Comment: Performed at [facility], [address], [place], [place] [address]  CBC with Differential     Status: Abnormal   Collection Time: 06/06/17  1:49 AM  Result Value  Ref Range   WBC 10.9 (H) 4.0 - 10.5 K/uL   RBC 5.12 (H) 3.87 - 5.11 MIL/uL   Hemoglobin 11.4 (L) 12.0 - 15.0 g/dL   HCT 37.1 36.0 - 46.0 %   MCV 72.5 (L) 78.0 - 100.0 fL   MCH 22.3 (L) 26.0 - 34.0 pg   MCHC 30.7 30.0 - 36.0 g/dL   RDW 13.6 11.5 - 15.5 %   Platelets 364 150 - 400 K/uL   Neutrophils Relative % 81 %   Neutro Abs 8.8 (H) 1.7 - 7.7 K/uL   Lymphocytes Relative 14 %   Lymphs Abs 1.5 0.7 - 4.0 K/uL   Monocytes Relative 4 %   Monocytes Absolute 0.5 0.1 - 1.0 K/uL   Eosinophils Relative 1 %   Eosinophils Absolute 0.1 0.0 - 0.7 K/uL   Basophils Relative 0 %   Basophils Absolute 0.0 0.0 - 0.1 K/uL    Comment: Performed at [facility], [address], [place], [place] [address]    Ct Abdomen Pelvis W Contrast  Result Date: 06/05/2017 CLINICAL DATA:  Acute generalized abdominal pain, nausea, vomiting. EXAM: CT ABDOMEN AND PELVIS WITH CONTRAST TECHNIQUE: Multidetector  CT imaging of the abdomen and pelvis was performed using the standard protocol following bolus administration of intravenous contrast. CONTRAST:  145m ISOVUE-300 IOPAMIDOL (ISOVUE-300) INJECTION 61% COMPARISON:  None. FINDINGS: Lower chest: No acute abnormality. Hepatobiliary: Cholelithiasis without inflammation. Liver appears normal. No biliary dilatation is noted. Pancreas: Unremarkable. No pancreatic ductal dilatation or surrounding inflammatory changes. Spleen: Normal in size without focal abnormality. Adrenals/Urinary Tract: Adrenal glands appear normal. Small right renal cyst is noted. No hydronephrosis or renal obstruction is noted. No renal or ureteral calculi are noted. Urinary bladder is unremarkable. Stomach/Bowel: Stomach is within normal limits. Appendix appears normal. No evidence of bowel wall thickening, distention, or inflammatory changes. Vascular/Lymphatic: No significant vascular findings are present. No enlarged abdominal or pelvic lymph nodes. Reproductive: Intrauterine device is noted. No adnexal abnormality is noted. Other: No abdominal wall hernia or abnormality. No abdominopelvic ascites. Musculoskeletal: No acute or significant osseous findings. IMPRESSION: Cholelithiasis. No other abnormality seen in the abdomen or pelvis. Electronically Signed   By: JMarijo Conception M.D.   On: 06/05/2017 08:49   [place]Abdomen Limited Ruq  Result Date: 06/06/2017 CLINICAL DATA:  Abdominal pain EXAM: ULTRASOUND ABDOMEN LIMITED RIGHT UPPER QUADRANT COMPARISON:  None. FINDINGS: Gallbladder: Multiple gallstones are present. The largest is 1.9 cm. Negative Murphy sign. There is gallbladder wall thickening measuring up to 7 mm. A small amount of pericholecystic fluid is suspected. Common bile duct: Diameter: 4 mm. Liver: No focal lesion identified. Within normal limits in parenchymal echogenicity. Portal vein is patent on color Doppler imaging with normal direction of blood flow towards the liver. IMPRESSION:  Cholelithiasis. There is nonspecific gallbladder wall thickening and pericholecystic fluid. Correlate clinically as for the need for nuclear medicine imaging to assess for cystic duct patency. The liver and biliary tree are otherwise within normal limits. Electronically Signed   By: AMarybelle KillingsM.D.   On: 06/06/2017 10:22    ROS:  Pertinent items are noted in HPI.  Blood pressure (!) 143/70, pulse (!) 58, temperature 98.4 F (36.9 C), temperature source Oral, resp. rate 19, height _0  (1.6 m), weight 176 lb 5.9 oz (80 kg), last menstrual period 11/19/2011, SpO2 99 %. Physical Exam: Well-developed well-nourished black female in no acute distress Head is normocephalic, atraumatic Eyes without scleral icterus Lungs clear to auscultation with equal breath  sounds bilaterally Heart examination reveals a regular rate and rhythm without S3, S4, murmurs Abdomen is soft with minimal tenderness to deep palpation in the right upper quadrant.  No hepatosplenomegaly, masses, hernias are noted.  No rigidity is noted.  CT scan of abdomen and ultrasound reports reviewed  Assessment/Plan: Impression: Cholecystitis with cholelithiasis Plan: Patient will be taken to the operating room tomorrow for laparoscopic cholecystectomy.  The risks and benefits of the procedure including bleeding, infection, hepatobiliary injury, and the possibility of an open procedure were fully explained to the patient, who gave informed consent.  Aviva Signs 06/06/2017, 11:04 AM

## 2017-06-06 NOTE — ED Triage Notes (Signed)
Pt c/o generalized abdominal pain that radiates around to both sides of her back; pt was seen here this am for same complaint and given medications; pt last took zofran at 10pm this evening and has taken tylenol with no relief

## 2017-06-06 NOTE — Progress Notes (Signed)
06/06/2017 12:10 AM  06/06/2017 12:45 PM  Abigail Butts T Cruces was seen and examined.  The H&P by the admitting provider, orders, imaging was reviewed.  Please see new orders.  Will continue to follow.  I spoke with Dr. Arnoldo Morale.  The patient is going to the OR tomorrow morning.    Vitals:   06/06/17 0509 06/06/17 0917  BP: (!) 165/86 (!) 143/70  Pulse: (!) 58 (!) 58  Resp: 19   Temp: 98.9 F (37.2 C) 98.4 F (36.9 C)  SpO2: 100% 99%   Results for orders placed or performed during the hospital encounter of 06/06/17  Comprehensive metabolic panel  Result Value Ref Range   Sodium 139 135 - 145 mmol/L   Potassium 3.7 3.5 - 5.1 mmol/L   Chloride 103 101 - 111 mmol/L   CO2 26 22 - 32 mmol/L   Glucose, Bld 121 (H) 65 - 99 mg/dL   BUN 9 6 - 20 mg/dL   Creatinine, Ser 0.79 0.44 - 1.00 mg/dL   Calcium 9.5 8.9 - 10.3 mg/dL   Total Protein 8.1 6.5 - 8.1 g/dL   Albumin 4.5 3.5 - 5.0 g/dL   AST 25 15 - 41 U/L   ALT 24 14 - 54 U/L   Alkaline Phosphatase 70 38 - 126 U/L   Total Bilirubin 1.4 (H) 0.3 - 1.2 mg/dL   GFR calc non Af Amer >60 >60 mL/min   GFR calc Af Amer >60 >60 mL/min   Anion gap 10 5 - 15  Lipase, blood  Result Value Ref Range   Lipase 25 11 - 51 U/L  CBC with Differential  Result Value Ref Range   WBC 10.9 (H) 4.0 - 10.5 K/uL   RBC 5.12 (H) 3.87 - 5.11 MIL/uL   Hemoglobin 11.4 (L) 12.0 - 15.0 g/dL   HCT 37.1 36.0 - 46.0 %   MCV 72.5 (L) 78.0 - 100.0 fL   MCH 22.3 (L) 26.0 - 34.0 pg   MCHC 30.7 30.0 - 36.0 g/dL   RDW 13.6 11.5 - 15.5 %   Platelets 364 150 - 400 K/uL   Neutrophils Relative % 81 %   Neutro Abs 8.8 (H) 1.7 - 7.7 K/uL   Lymphocytes Relative 14 %   Lymphs Abs 1.5 0.7 - 4.0 K/uL   Monocytes Relative 4 %   Monocytes Absolute 0.5 0.1 - 1.0 K/uL   Eosinophils Relative 1 %   Eosinophils Absolute 0.1 0.0 - 0.7 K/uL   Basophils Relative 0 %   Basophils Absolute 0.0 0.0 - 0.1 K/uL    C. Wynetta Emery, MD [facility]

## 2017-06-06 NOTE — Consult Note (Signed)
Reason for Consult: Right upper quadrant abdominal pain, cholelithiasis Referring Physician: Dr. Elyse Jarvis T Amy Parrish is an 57 y.o. female.  HPI: Patient is a 57 year old white female with a history of alpha thalassemia who presented to the emergency room with worsening right upper quadrant abdominal pain.  She was seen in the emergency room twice as she initially went home but returned back to the emergency room with worsening nausea and pain.  She states this is the first episode of pain.  She denies any fever or chills.  Ultrasound gallbladder reveals cholelithiasis with pericholecystic fluid, mildly thickened wall, and normal common bile duct.  She denies any history of jaundice.  She currently has pain of 2 out of 10.  She did receive pain medication.  Past Medical History:  Diagnosis Date  . Anemia    seen by Dr. Tressie Stalker, records alpha thalassemia trait  . Seasonal allergies     Past Surgical History:  Procedure Laterality Date  . COLONOSCOPY  10/16/2010   Procedure: COLONOSCOPY;  Surgeon: Dorothyann Peng, MD;  Location: [facility];  Service: Endoscopy;  Laterality: N/A;  11:30  . left hand surgery      Family History  Problem Relation Age of Onset  . Hypertension Father   . Hypertension Mother   . Kidney failure Mother        early stages  . Colon cancer Neg Hx     Social History:  reports that she has never smoked. She has never used smokeless tobacco. She reports that she does not drink alcohol or use drugs.  Allergies:  Allergies  Allergen Reactions  . Bee Venom     Medications: I have reviewed the patient's current medications.  Results for orders placed or performed during the hospital encounter of 06/06/17 (from the past 48 hour(s))  Comprehensive metabolic panel     Status: Abnormal   Collection Time: 06/06/17  1:49 AM  Result Value Ref Range   Sodium 139 135 - 145 mmol/L   Potassium 3.7 3.5 - 5.1 mmol/L   Chloride 103 101 - 111 mmol/L   CO2 26 22 -  32 mmol/L   Glucose, Bld 121 (H) 65 - 99 mg/dL   BUN 9 6 - 20 mg/dL   Creatinine, Ser 0.79 0.44 - 1.00 mg/dL   Calcium 9.5 8.9 - 10.3 mg/dL   Total Protein 8.1 6.5 - 8.1 g/dL   Albumin 4.5 3.5 - 5.0 g/dL   AST 25 15 - 41 U/L   ALT 24 14 - 54 U/L   Alkaline Phosphatase 70 38 - 126 U/L   Total Bilirubin 1.4 (H) 0.3 - 1.2 mg/dL   GFR calc non Af Amer >60 >60 mL/min   GFR calc Af Amer >60 >60 mL/min    Comment: (NOTE) The eGFR has been calculated using the CKD EPI equation. This calculation has not been validated in all clinical situations. eGFR's persistently <60 mL/min signify possible Chronic Kidney Disease.    Anion gap 10 5 - 15    Comment: Performed at [facility], [address], [place], [place] [address]  Lipase, blood     Status: None   Collection Time: 06/06/17  1:49 AM  Result Value Ref Range   Lipase 25 11 - 51 U/L    Comment: Performed at [facility], [address], [place], [place] [address]  CBC with Differential     Status: Abnormal   Collection Time: 06/06/17  1:49 AM  Result Value  Ref Range   WBC 10.9 (H) 4.0 - 10.5 K/uL   RBC 5.12 (H) 3.87 - 5.11 MIL/uL   Hemoglobin 11.4 (L) 12.0 - 15.0 g/dL   HCT 37.1 36.0 - 46.0 %   MCV 72.5 (L) 78.0 - 100.0 fL   MCH 22.3 (L) 26.0 - 34.0 pg   MCHC 30.7 30.0 - 36.0 g/dL   RDW 13.6 11.5 - 15.5 %   Platelets 364 150 - 400 K/uL   Neutrophils Relative % 81 %   Neutro Abs 8.8 (H) 1.7 - 7.7 K/uL   Lymphocytes Relative 14 %   Lymphs Abs 1.5 0.7 - 4.0 K/uL   Monocytes Relative 4 %   Monocytes Absolute 0.5 0.1 - 1.0 K/uL   Eosinophils Relative 1 %   Eosinophils Absolute 0.1 0.0 - 0.7 K/uL   Basophils Relative 0 %   Basophils Absolute 0.0 0.0 - 0.1 K/uL    Comment: Performed at [facility], [address], [place], [place] [address]    Ct Abdomen Pelvis W Contrast  Result Date: 06/05/2017 CLINICAL DATA:  Acute generalized abdominal pain, nausea, vomiting. EXAM: CT ABDOMEN AND PELVIS WITH CONTRAST TECHNIQUE: Multidetector  CT imaging of the abdomen and pelvis was performed using the standard protocol following bolus administration of intravenous contrast. CONTRAST:  145m ISOVUE-300 IOPAMIDOL (ISOVUE-300) INJECTION 61% COMPARISON:  None. FINDINGS: Lower chest: No acute abnormality. Hepatobiliary: Cholelithiasis without inflammation. Liver appears normal. No biliary dilatation is noted. Pancreas: Unremarkable. No pancreatic ductal dilatation or surrounding inflammatory changes. Spleen: Normal in size without focal abnormality. Adrenals/Urinary Tract: Adrenal glands appear normal. Small right renal cyst is noted. No hydronephrosis or renal obstruction is noted. No renal or ureteral calculi are noted. Urinary bladder is unremarkable. Stomach/Bowel: Stomach is within normal limits. Appendix appears normal. No evidence of bowel wall thickening, distention, or inflammatory changes. Vascular/Lymphatic: No significant vascular findings are present. No enlarged abdominal or pelvic lymph nodes. Reproductive: Intrauterine device is noted. No adnexal abnormality is noted. Other: No abdominal wall hernia or abnormality. No abdominopelvic ascites. Musculoskeletal: No acute or significant osseous findings. IMPRESSION: Cholelithiasis. No other abnormality seen in the abdomen or pelvis. Electronically Signed   By: JMarijo Conception M.D.   On: 06/05/2017 08:49   UKoreaAbdomen Limited Ruq  Result Date: 06/06/2017 CLINICAL DATA:  Abdominal pain EXAM: ULTRASOUND ABDOMEN LIMITED RIGHT UPPER QUADRANT COMPARISON:  None. FINDINGS: Gallbladder: Multiple gallstones are present. The largest is 1.9 cm. Negative Murphy sign. There is gallbladder wall thickening measuring up to 7 mm. A small amount of pericholecystic fluid is suspected. Common bile duct: Diameter: 4 mm. Liver: No focal lesion identified. Within normal limits in parenchymal echogenicity. Portal vein is patent on color Doppler imaging with normal direction of blood flow towards the liver. IMPRESSION:  Cholelithiasis. There is nonspecific gallbladder wall thickening and pericholecystic fluid. Correlate clinically as for the need for nuclear medicine imaging to assess for cystic duct patency. The liver and biliary tree are otherwise within normal limits. Electronically Signed   By: AMarybelle KillingsM.D.   On: 06/06/2017 10:22    ROS:  Pertinent items are noted in HPI.  Blood pressure (!) 143/70, pulse (!) 58, temperature 98.4 F (36.9 C), temperature source Oral, resp. rate 19, height _0  (1.6 m), weight 176 lb 5.9 oz (80 kg), last menstrual period 11/19/2011, SpO2 99 %. Physical Exam: Well-developed well-nourished black female in no acute distress Head is normocephalic, atraumatic Eyes without scleral icterus Lungs clear to auscultation with equal breath  sounds bilaterally Heart examination reveals a regular rate and rhythm without S3, S4, murmurs Abdomen is soft with minimal tenderness to deep palpation in the right upper quadrant.  No hepatosplenomegaly, masses, hernias are noted.  No rigidity is noted.  CT scan of abdomen and ultrasound reports reviewed  Assessment/Plan: Impression: Cholecystitis with cholelithiasis Plan: Patient will be taken to the operating room tomorrow for laparoscopic cholecystectomy.  The risks and benefits of the procedure including bleeding, infection, hepatobiliary injury, and the possibility of an open procedure were fully explained to the patient, who gave informed consent.  Aviva Signs 06/06/2017, 11:04 AM

## 2017-06-07 ENCOUNTER — Encounter (HOSPITAL_COMMUNITY)
Admission: EM | Disposition: A | Discharge status: Home / Self Care | Payer: Self-pay | Source: Home / Self Care | Attending: Family Medicine

## 2017-06-07 ENCOUNTER — Encounter (HOSPITAL_COMMUNITY): Payer: Self-pay

## 2017-06-07 ENCOUNTER — Inpatient Hospital Stay (HOSPITAL_COMMUNITY): Payer: BC Managed Care – PPO | Admitting: Anesthesiology

## 2017-06-07 DIAGNOSIS — D649 Anemia, unspecified: Secondary | ICD-10-CM

## 2017-06-07 HISTORY — PX: CHOLECYSTECTOMY: SHX55

## 2017-06-07 LAB — CBC
HEMATOCRIT: 33.3 % — AB (ref 36.0–46.0)
Hemoglobin: 9.9 g/dL — ABNORMAL LOW (ref 12.0–15.0)
MCH: 22 pg — AB (ref 26.0–34.0)
MCHC: 29.7 g/dL — AB (ref 30.0–36.0)
MCV: 74 fL — AB (ref 78.0–100.0)
Platelets: 319 10*3/uL (ref 150–400)
RBC: 4.5 MIL/uL (ref 3.87–5.11)
RDW: 13.7 % (ref 11.5–15.5)
WBC: 10 10*3/uL (ref 4.0–10.5)

## 2017-06-07 LAB — COMPREHENSIVE METABOLIC PANEL
ALT: 23 U/L (ref 14–54)
ANION GAP: 6 (ref 5–15)
AST: 25 U/L (ref 15–41)
Albumin: 3.6 g/dL (ref 3.5–5.0)
Alkaline Phosphatase: 53 U/L (ref 38–126)
BILIRUBIN TOTAL: 1.3 mg/dL — AB (ref 0.3–1.2)
BUN: 10 mg/dL (ref 6–20)
CO2: 28 mmol/L (ref 22–32)
Calcium: 8.6 mg/dL — ABNORMAL LOW (ref 8.9–10.3)
Chloride: 104 mmol/L (ref 101–111)
Creatinine, Ser: 0.71 mg/dL (ref 0.44–1.00)
GFR calc Af Amer: >60 mL/min (ref >60)
Glucose, Bld: 98 mg/dL (ref 65–99)
POTASSIUM: 3.8 mmol/L (ref 3.5–5.1)
Sodium: 138 mmol/L (ref 135–145)
TOTAL PROTEIN: 6.7 g/dL (ref 6.5–8.1)

## 2017-06-07 SURGERY — LAPAROSCOPIC CHOLECYSTECTOMY
Anesthesia: General | Site: Abdomen

## 2017-06-07 MED ORDER — LACTATED RINGERS IV SOLN
INTRAVENOUS | Status: DC | PRN
  Administered 2017-06-07: 09:00:00 via INTRAVENOUS

## 2017-06-07 MED ORDER — ONDANSETRON HCL 4 MG/2ML IJ SOLN
INTRAMUSCULAR | Status: AC
  Filled 2017-06-07: qty 2

## 2017-06-07 MED ORDER — MIDAZOLAM HCL 2 MG/2ML IJ SOLN
INTRAMUSCULAR | Status: AC
  Filled 2017-06-07: qty 2

## 2017-06-07 MED ORDER — BUPIVACAINE HCL (PF) 0.5 % IJ SOLN
INTRAMUSCULAR | Status: AC
  Filled 2017-06-07: qty 30

## 2017-06-07 MED ORDER — HEMOSTATIC AGENTS (NO CHARGE) OPTIME
TOPICAL | Status: DC | PRN
  Administered 2017-06-07: 1 via TOPICAL

## 2017-06-07 MED ORDER — KETOROLAC TROMETHAMINE 30 MG/ML IJ SOLN
INTRAMUSCULAR | Status: AC
  Filled 2017-06-07: qty 1

## 2017-06-07 MED ORDER — HYDROCODONE-ACETAMINOPHEN 7.5-325 MG PO TABS: 1.0000 | ORAL_TABLET | Freq: Once | ORAL | Status: DC | PRN

## 2017-06-07 MED ORDER — BUPIVACAINE HCL (PF) 0.5 % IJ SOLN
INTRAMUSCULAR | Status: DC | PRN
  Administered 2017-06-07: 10 mL

## 2017-06-07 MED ORDER — FENTANYL CITRATE (PF) 100 MCG/2ML IJ SOLN
INTRAMUSCULAR | Status: DC | PRN
  Administered 2017-06-07 (×3): 50 ug via INTRAVENOUS

## 2017-06-07 MED ORDER — KETOROLAC TROMETHAMINE 30 MG/ML IJ SOLN
30.0000 mg | Freq: Four times a day (QID) | INTRAMUSCULAR | Status: AC
  Administered 2017-06-07: 30 mg via INTRAVENOUS
  Filled 2017-06-07: qty 1

## 2017-06-07 MED ORDER — MIDAZOLAM HCL 5 MG/5ML IJ SOLN
INTRAMUSCULAR | Status: DC | PRN
  Administered 2017-06-07: 2 mg via INTRAVENOUS

## 2017-06-07 MED ORDER — LIDOCAINE HCL (CARDIAC) PF 100 MG/5ML IV SOSY
PREFILLED_SYRINGE | INTRAVENOUS | Status: DC | PRN
  Administered 2017-06-07: 40 mg via INTRAVENOUS

## 2017-06-07 MED ORDER — POVIDONE-IODINE 10 % EX OINT
TOPICAL_OINTMENT | CUTANEOUS | Status: AC
  Filled 2017-06-07: qty 1

## 2017-06-07 MED ORDER — SIMETHICONE 80 MG PO CHEW: 40.0000 mg | CHEWABLE_TABLET | Freq: Four times a day (QID) | ORAL | Status: DC | PRN

## 2017-06-07 MED ORDER — PROPOFOL 10 MG/ML IV BOLUS
INTRAVENOUS | Status: DC | PRN
  Administered 2017-06-07: 150 mg via INTRAVENOUS

## 2017-06-07 MED ORDER — HYDROCODONE-ACETAMINOPHEN 5-325 MG PO TABS: 1.0000 | ORAL_TABLET | ORAL | Status: DC | PRN

## 2017-06-07 MED ORDER — SUGAMMADEX SODIUM 200 MG/2ML IV SOLN
INTRAVENOUS | Status: AC
  Filled 2017-06-07: qty 2

## 2017-06-07 MED ORDER — POVIDONE-IODINE 10 % OINT PACKET
TOPICAL_OINTMENT | CUTANEOUS | Status: DC | PRN
  Administered 2017-06-07: 1 via TOPICAL

## 2017-06-07 MED ORDER — ENOXAPARIN SODIUM 40 MG/0.4ML ~~LOC~~ SOLN: 40.0000 mg | SUBCUTANEOUS | Status: DC

## 2017-06-07 MED ORDER — SUGAMMADEX SODIUM 200 MG/2ML IV SOLN
INTRAVENOUS | Status: DC | PRN
  Administered 2017-06-07: 160 mg via INTRAVENOUS

## 2017-06-07 MED ORDER — FENTANYL CITRATE (PF) 100 MCG/2ML IJ SOLN
25.0000 ug | INTRAMUSCULAR | Status: DC | PRN
  Administered 2017-06-07 (×2): 50 ug via INTRAVENOUS
  Filled 2017-06-07: qty 2

## 2017-06-07 MED ORDER — LACTATED RINGERS IV SOLN
INTRAVENOUS | Status: DC
  Administered 2017-06-07: 22:00:00 via INTRAVENOUS

## 2017-06-07 MED ORDER — ONDANSETRON HCL 4 MG/2ML IJ SOLN
INTRAMUSCULAR | Status: DC | PRN
  Administered 2017-06-07: 4 mg via INTRAVENOUS

## 2017-06-07 MED ORDER — KETOROLAC TROMETHAMINE 30 MG/ML IJ SOLN
30.0000 mg | Freq: Four times a day (QID) | INTRAMUSCULAR | Status: DC | PRN
  Administered 2017-06-07 – 2017-06-08 (×2): 30 mg via INTRAVENOUS
  Filled 2017-06-07: qty 1

## 2017-06-07 MED ORDER — DEXAMETHASONE SODIUM PHOSPHATE 4 MG/ML IJ SOLN
INTRAMUSCULAR | Status: AC
  Filled 2017-06-07: qty 1

## 2017-06-07 MED ORDER — FENTANYL CITRATE (PF) 250 MCG/5ML IJ SOLN
INTRAMUSCULAR | Status: AC
  Filled 2017-06-07: qty 5

## 2017-06-07 MED ORDER — ROCURONIUM BROMIDE 100 MG/10ML IV SOLN
INTRAVENOUS | Status: DC | PRN
  Administered 2017-06-07: 40 mg via INTRAVENOUS

## 2017-06-07 MED ORDER — HYDROMORPHONE HCL 1 MG/ML IJ SOLN
1.0000 mg | INTRAMUSCULAR | Status: DC | PRN
  Administered 2017-06-07 – 2017-06-08 (×3): 1 mg via INTRAVENOUS
  Filled 2017-06-07 (×3): qty 1

## 2017-06-07 MED ORDER — PROPOFOL 10 MG/ML IV BOLUS
INTRAVENOUS | Status: AC
  Filled 2017-06-07: qty 20

## 2017-06-07 MED ORDER — DEXAMETHASONE SODIUM PHOSPHATE 4 MG/ML IJ SOLN
INTRAMUSCULAR | Status: DC | PRN
  Administered 2017-06-07: 4 mg via INTRAVENOUS

## 2017-06-07 MED ORDER — ROCURONIUM BROMIDE 50 MG/5ML IV SOLN
INTRAVENOUS | Status: AC
  Filled 2017-06-07: qty 1

## 2017-06-07 MED ORDER — CIPROFLOXACIN IN D5W 400 MG/200ML IV SOLN
400.0000 mg | Freq: Two times a day (BID) | INTRAVENOUS | Status: DC
  Administered 2017-06-07: 400 mg via INTRAVENOUS
  Filled 2017-06-07: qty 200

## 2017-06-07 MED ORDER — SODIUM CHLORIDE 0.9 % IR SOLN
Status: DC | PRN
  Administered 2017-06-07: 1000 mL

## 2017-06-07 MED ORDER — LIDOCAINE HCL (PF) 1 % IJ SOLN
INTRAMUSCULAR | Status: AC
  Filled 2017-06-07: qty 5

## 2017-06-07 SURGICAL SUPPLY — 47 items
APPLIER CLIP ROT 10 11.4 M/L (STAPLE) ×3
BAG RETRIEVAL 10 (BASKET) ×1
BAG RETRIEVAL 10MM (BASKET) ×1
CHLORAPREP W/TINT 26ML (MISCELLANEOUS) ×3 IMPLANT
CLIP APPLIE ROT 10 11.4 M/L (STAPLE) ×1 IMPLANT
CLOTH BEACON ORANGE TIMEOUT ST (SAFETY) ×3 IMPLANT
COVER LIGHT HANDLE STERIS (MISCELLANEOUS) ×6 IMPLANT
DECANTER SPIKE VIAL GLASS SM (MISCELLANEOUS) ×3 IMPLANT
ELECT REM PT RETURN 9FT ADLT (ELECTROSURGICAL) ×3
ELECTRODE REM PT RTRN 9FT ADLT (ELECTROSURGICAL) ×1 IMPLANT
FILTER SMOKE EVAC LAPAROSHD (FILTER) ×3 IMPLANT
GLOVE BIO SURGEON STRL SZ7 (GLOVE) ×4 IMPLANT
GLOVE BIOGEL PI IND STRL 7.0 (GLOVE) ×1 IMPLANT
GLOVE BIOGEL PI INDICATOR 7.0 (GLOVE) ×6
GLOVE SURG SS PI 7.5 STRL IVOR (GLOVE) ×3 IMPLANT
GOWN STRL REUS W/ TWL XL LVL3 (GOWN DISPOSABLE) ×1 IMPLANT
GOWN STRL REUS W/TWL LRG LVL3 (GOWN DISPOSABLE) ×6 IMPLANT
GOWN STRL REUS W/TWL XL LVL3 (GOWN DISPOSABLE) ×3
HEMOSTAT SNOW SURGICEL 2X4 (HEMOSTASIS) ×3 IMPLANT
INST SET LAPROSCOPIC AP (KITS) ×3 IMPLANT
IV NS IRRIG 3000ML ARTHROMATIC (IV SOLUTION) IMPLANT
KIT TURNOVER KIT A (KITS) ×3 IMPLANT
MANIFOLD NEPTUNE II (INSTRUMENTS) ×3 IMPLANT
NDL HYPO 25X1 1.5 SAFETY (NEEDLE) ×1 IMPLANT
NDL INSUFFLATION 14GA 120MM (NEEDLE) ×1 IMPLANT
NEEDLE HYPO 25X1 1.5 SAFETY (NEEDLE) ×3 IMPLANT
NEEDLE INSUFFLATION 14GA 120MM (NEEDLE) ×3 IMPLANT
NS IRRIG 1000ML POUR BTL (IV SOLUTION) ×3 IMPLANT
PACK LAP CHOLE LZT030E (CUSTOM PROCEDURE TRAY) ×3 IMPLANT
PAD ARMBOARD 7.5X6 YLW CONV (MISCELLANEOUS) ×3 IMPLANT
SET BASIN LINEN APH (SET/KITS/TRAYS/PACK) ×3 IMPLANT
SET TUBE IRRIG SUCTION NO TIP (IRRIGATION / IRRIGATOR) IMPLANT
SLEEVE ENDOPATH XCEL 5M (ENDOMECHANICALS) ×3 IMPLANT
SPONGE GAUZE 2X2 8PLY STER LF (GAUZE/BANDAGES/DRESSINGS) ×1
SPONGE GAUZE 2X2 8PLY STRL LF (GAUZE/BANDAGES/DRESSINGS) ×5 IMPLANT
STAPLER VISISTAT (STAPLE) ×3 IMPLANT
SUT VICRYL 0 UR6 27IN ABS (SUTURE) ×3 IMPLANT
SYS BAG RETRIEVAL 10MM (BASKET) ×1
SYSTEM BAG RETRIEVAL 10MM (BASKET) ×1 IMPLANT
TAPE PAPER 2X10 WHT MICROPORE (GAUZE/BANDAGES/DRESSINGS) ×2 IMPLANT
TROCAR ENDO BLADELESS 11MM (ENDOMECHANICALS) ×3 IMPLANT
TROCAR XCEL NON-BLD 5MMX100MML (ENDOMECHANICALS) ×3 IMPLANT
TROCAR XCEL UNIV SLVE 11M 100M (ENDOMECHANICALS) ×3 IMPLANT
TUBE CONNECTING 12'X1/4 (SUCTIONS) ×1
TUBE CONNECTING 12X1/4 (SUCTIONS) ×2 IMPLANT
TUBING INSUFFLATION (TUBING) ×3 IMPLANT
WARMER LAPAROSCOPE (MISCELLANEOUS) ×3 IMPLANT

## 2017-06-07 NOTE — Interval H&P Note (Signed)
History and Physical Interval Note:  06/07/2017 8:26 AM  Amy Parrish  has presented today for surgery, with the diagnosis of cholecystitis, cholelithiasis  The various methods of treatment have been discussed with the patient and family. After consideration of risks, benefits and other options for treatment, the patient has consented to  Procedure(s): LAPAROSCOPIC CHOLECYSTECTOMY (N/A) as a surgical intervention .  The patient's history has been reviewed, patient examined, no change in status, stable for surgery.  I have reviewed the patient's chart and labs.  Questions were answered to the patient's satisfaction.     Aviva Signs

## 2017-06-07 NOTE — Anesthesia Preprocedure Evaluation (Signed)
Anesthesia Evaluation  Patient identified by MRN, date of birth, ID band Patient awake    Reviewed: Allergy & Precautions, NPO status , Patient's Chart, lab work & pertinent test results  Airway Mallampati: III  TM Distance: >3 FB Neck ROM: Full    Dental no notable dental hx.    Pulmonary neg pulmonary ROS,    Pulmonary exam normal breath sounds clear to auscultation       Cardiovascular Exercise Tolerance: Good negative cardio ROS Normal cardiovascular examI Rhythm:Regular Rate:Normal     Neuro/Psych negative neurological ROS  negative psych ROS   GI/Hepatic negative GI ROS, Neg liver ROS,   Endo/Other  negative endocrine ROS  Renal/GU negative Renal ROS  negative genitourinary   Musculoskeletal negative musculoskeletal ROS (+)   Abdominal   Peds negative pediatric ROS (+)  Hematology negative hematology ROS (+)   Anesthesia Other Findings   Reproductive/Obstetrics negative OB ROS                             Anesthesia Physical Anesthesia Plan  ASA: II  Anesthesia Plan: General   Post-op Pain Management:    Induction: Intravenous  PONV Risk Score and Plan:   Airway Management Planned:   Additional Equipment:   Intra-op Plan:   Post-operative Plan: Extubation in OR  Informed Consent: I have reviewed the patients History and Physical, chart, labs and discussed the procedure including the risks, benefits and alternatives for the proposed anesthesia with the patient or authorized representative who has indicated his/her understanding and acceptance.     Plan Discussed with: CRNA  Anesthesia Plan Comments:         Anesthesia Quick Evaluation

## 2017-06-07 NOTE — Anesthesia Postprocedure Evaluation (Signed)
Anesthesia Post Note  Patient: Amy Parrish  Procedure(s) Performed: LAPAROSCOPIC CHOLECYSTECTOMY (N/A Abdomen)  Patient location during evaluation: PACU Anesthesia Type: General Level of consciousness: awake and alert and oriented Pain management: pain level controlled Vital Signs Assessment: post-procedure vital signs reviewed and stable Respiratory status: spontaneous breathing Cardiovascular status: stable Postop Assessment: no apparent nausea or vomiting Anesthetic complications: no     Last Vitals:  Vitals:   06/07/17 1000 06/07/17 1015  BP: 139/62 (!) 130/55  Pulse: 82 77  Resp: 18 10  Temp:    SpO2: 98% 94%    Last Pain:  Vitals:   06/07/17 1015  TempSrc:   PainSc: Asleep                 ADAMS, AMY A

## 2017-06-07 NOTE — Anesthesia Procedure Notes (Signed)
Procedure Name: Intubation Date/Time: 06/07/2017 9:03 AM Performed by: Andree Elk, Katheryne Gorr A, CRNA Pre-anesthesia Checklist: Patient identified, Patient being monitored, Timeout performed, Emergency Drugs available and Suction available Patient Re-evaluated:Patient Re-evaluated prior to induction Oxygen Delivery Method: Circle system utilized Preoxygenation: Pre-oxygenation with 100% oxygen Induction Type: IV induction Ventilation: Mask ventilation without difficulty Laryngoscope Size: Mac and 3 Grade View: Grade I Tube type: Oral Tube size: 7.0 mm Number of attempts: 1 Airway Equipment and Method: Stylet Placement Confirmation: ETT inserted through vocal cords under direct vision,  positive ETCO2 and breath sounds checked- equal and bilateral Secured at: 21 cm Tube secured with: Tape Dental Injury: Teeth and Oropharynx as per pre-operative assessment

## 2017-06-07 NOTE — Progress Notes (Signed)
06/07/2017 6:09 PM    PROGRESS NOTE    Amy Parrish  AST:[id number]  DOB: 02-01-1960  DOA: 06/06/2017 PCP: Redmond School, MD   Brief Admission Hx:  HPI: Amy Parrish is a 57 y.o. female with medical history significant for anemia with alpha thalassemia who presented to the emergency department earlier on the morning of 5/26 when she was awakened with bilateral upper abdominal pain with radiation to her bilateral back.  She was admitted with acute abdominal pain from cholelithiasis.   MDM/Assessment & Plan:   1. POD#0 s/p cholecystectomy - pt recovering well from surgery. Post op management per Dr. Arnoldo Morale.  Pt hopeful to be able to go home tomorrow.  Pain and nausea is well controlled.  See orders.  Disposition per surgery team.  2. Anemia with alpha thalassemia trait - hg down to 9.9 with hydration, follow, recheck CBC in AM for post-op assessment.   DVT prophylaxis: enoxaparin/ambulation Code Status: Full  Family Communication: bedside Disposition Plan: Pt is hopeful to discharge home tomorrow.   Subjective: Pt recovering from surgery well, no complaints at this time.    Objective: Vitals:   06/07/17 1100 06/07/17 1209 06/07/17 1313 06/07/17 1441  BP: 122/70 (!) 119/59 122/65 133/72  Pulse: 63 72 77 87  Resp: 12 20    Temp: 98.9 F (37.2 C) 98.4 F (36.9 C) 98.3 F (36.8 C) 98.3 F (36.8 C)  TempSrc:  Oral Oral Oral  SpO2: 93% 98% 100% 100%  Weight:      Height:        Intake/Output Summary (Last 24 hours) at 06/07/2017 1809 Last data filed at 06/07/2017 1700 Gross per 24 hour  Intake 1143.75 ml  Output 10 ml  Net 1133.75 ml   Filed Weights   06/06/17 0017 06/06/17 0600  Weight: 81.6 kg (180 lb) 80 kg (176 lb 5.9 oz)   REVIEW OF SYSTEMS  As per history otherwise all reviewed and reported negative  Exam:  General exam: awake, alert, NAD.  Respiratory system: Clear. No increased work of breathing. Cardiovascular system: S1 & S2 heard, RRR. No  JVD, murmurs, gallops, clicks or pedal edema. Gastrointestinal system: Abdomen is soft, wound clean and dry. Central nervous system: Alert and oriented. No focal neurological deficits. Extremities: no CCE.  Data Reviewed: Basic Metabolic Panel: Recent Labs  Lab 06/05/17 0642 06/06/17 0149 06/07/17 0453  NA 137 139 138  K 3.6 3.7 3.8  CL 103 103 104  CO2 25 26 28   GLUCOSE 127* 121* 98  BUN 17 9 10   CREATININE 0.70 0.79 0.71  CALCIUM 9.1 9.5 8.6*   Liver Function Tests: Recent Labs  Lab 06/05/17 0642 06/06/17 0149 06/07/17 0453  AST 23 25 25   ALT 23 24 23   ALKPHOS 84 70 53  BILITOT 0.6 1.4* 1.3*  PROT 8.3* 8.1 6.7  ALBUMIN 4.6 4.5 3.6   Recent Labs  Lab 06/05/17 0642 06/06/17 0149  LIPASE 30 25   No results for input(s): AMMONIA in the last 168 hours. CBC: Recent Labs  Lab 06/05/17 0642 06/06/17 0149 06/07/17 0453  WBC 7.9 10.9* 10.0  NEUTROABS  --  8.8*  --   HGB 11.4* 11.4* 9.9*  HCT 37.8 37.1 33.3*  MCV 73.4* 72.5* 74.0*  PLT 347 364 319   Cardiac Enzymes: No results for input(s): CKTOTAL, CKMB, CKMBINDEX, TROPONINI in the last 168 hours. CBG (last 3)  No results for input(s): GLUCAP in the last 72 hours. Recent Results (from the past  240 hour(s))  Surgical PCR screen     Status: None   Collection Time: 06/06/17 11:54 AM  Result Value Ref Range Status   MRSA, PCR NEGATIVE NEGATIVE Final   Staphylococcus aureus NEGATIVE NEGATIVE Final    Comment: (NOTE) The Xpert SA Assay (FDA approved for NASAL specimens in patients 83 years of age and older), is one component of a comprehensive surveillance program. It is not intended to diagnose infection nor to guide or monitor treatment. Performed at [facility], [address], [place], [place] [address]      Studies: US Abdomen Limited Ruq  Result Date: 06/06/2017 CLINICAL DATA:  Abdominal pain EXAM: ULTRASOUND ABDOMEN LIMITED RIGHT UPPER QUADRANT COMPARISON:  None. FINDINGS: Gallbladder: Multiple  gallstones are present. The largest is 1.9 cm. Negative Murphy sign. There is gallbladder wall thickening measuring up to 7 mm. A small amount of pericholecystic fluid is suspected. Common bile duct: Diameter: 4 mm. Liver: No focal lesion identified. Within normal limits in parenchymal echogenicity. Portal vein is patent on color Doppler imaging with normal direction of blood flow towards the liver. IMPRESSION: Cholelithiasis. There is nonspecific gallbladder wall thickening and pericholecystic fluid. Correlate clinically as for the need for nuclear medicine imaging to assess for cystic duct patency. The liver and biliary tree are otherwise within normal limits. Electronically Signed   By: Marybelle Killings M.D.   On: 06/06/2017 10:22   Scheduled Meds: . [START ON 06/08/2017] enoxaparin (LOVENOX) injection  40 mg Subcutaneous Q24H   Continuous Infusions: . ciprofloxacin    . lactated ringers 75 mL/hr at 06/07/17 1209    Principal Problem:   Cholelithiasis Active Problems:   Anemia   Cholecystitis  Time spent:   Irwin Brakeman, MD, FAAFP [facility] Pager [phone]  If 7PM-7AM, please contact night-coverage [email or web address] Password [email or web address] 06/07/2017, 6:09 PM    LOS: 1 day

## 2017-06-07 NOTE — Transfer of Care (Signed)
Immediate Anesthesia Transfer of Care Note  Patient: Amy Parrish  Procedure(s) Performed: LAPAROSCOPIC CHOLECYSTECTOMY (N/A Abdomen)  Patient Location: PACU  Anesthesia Type:General  Level of Consciousness: awake, oriented and patient cooperative  Airway & Oxygen Therapy: Patient Spontanous Breathing and Patient connected to nasal cannula oxygen  Post-op Assessment: Report given to RN and Post -op Vital signs reviewed and stable  Post vital signs: Reviewed and stable  Last Vitals:  Vitals Value Taken Time  BP 139/68 06/07/2017  9:49 AM  Temp    Pulse 86 06/07/2017  9:51 AM  Resp 13 06/07/2017  9:51 AM  SpO2 96 % 06/07/2017  9:51 AM  Vitals shown include unvalidated device data.  Last Pain:  Vitals:   06/07/17 0723  TempSrc:   PainSc: 0-No pain      Patients Stated Pain Goal: 2 (57/50/51 8335)  Complications: No apparent anesthesia complications

## 2017-06-07 NOTE — Op Note (Signed)
Patient:  Amy Parrish  DOB:  28-Oct-1960  MRN:  [id number]   Preop Diagnosis: Acute cholecystitis, cholelithiasis  Postop Diagnosis: Same  Procedure: Laparoscopic cholecystectomy  Surgeon: Aviva Signs, MD  Anes: General endotracheal  Indications: Patient is a 57 year old black female who was found on examination to have acute cholecystitis secondary to cholelithiasis.  The risks and benefits of the procedure including bleeding, infection, hepatobiliary injury, and the possibility of an open procedure were fully explained to the patient, who gave informed consent.  Procedure note: The patient was placed in the supine position.  After induction of general endotracheal anesthesia, the abdomen was prepped and draped using the usual sterile technique with DuraPrep.  Surgical site confirmation was performed.  A supraumbilical incision was made down to the fascia.  A Veress needle was introduced into the abdominal cavity and confirmation of placement was done using the saline drop test.  The abdomen was then insufflated to 16 mmHg pressure.  An 11 mm trocar was introduced into the abdominal cavity under direct visualization without difficulty.  The patient was placed in reverse Trendelenburg position and an additional 11 mm trocar was placed in the epigastric region and 5 mm trochars were placed the right upper quadrant and right flank regions.  The liver was inspected and noted to be within normal limits.  The gallbladder was noted to be distended with a thickened, edematous wall.  The gallbladder was decompressed and hydrops was found.  The gallbladder was then retracted in a dynamic fashion in order to provide a critical view of the triangle of Calot.  The cystic duct was first identified.  Its juncture to the infundibulum fully identified.  Endoclips were placed proximally and distally on the cystic duct, and the cystic duct was divided.  This was likewise done the cystic artery.  The  gallbladder was freed away from the gallbladder fossa using Bovie electrocautery.  The gallbladder was delivered through the epigastric trocar site using an Endo Catch bag.  The gallbladder fossa was inspected and no abnormal bleeding or bile leakage was noted.  Surgicel was placed in the gallbladder fossa.  All fluid and air were then evacuated from the abdominal cavity prior to the removal of the trochars.  All wounds were irrigated with normal saline.  All wounds were injected with 0.5% Sensorcaine.  The supraumbilical fascia as well as epigastric fascia were reapproximated using 0 Vicryl interrupted sutures.  All skin incisions were closed using staples.  Betadine ointment and dry sterile dressings were applied.  All tape and needle counts were correct at the end of the procedure.  The patient was extubated in the operating room and transferred to PACU in stable condition.  Complications: None  EBL: Minimal  Specimen: Gallbladder

## 2017-06-08 ENCOUNTER — Encounter (HOSPITAL_COMMUNITY): Payer: Self-pay | Admitting: General Surgery

## 2017-06-08 LAB — CBC
HEMATOCRIT: 31.7 % — AB (ref 36.0–46.0)
HEMOGLOBIN: 9.4 g/dL — AB (ref 12.0–15.0)
MCH: 22 pg — AB (ref 26.0–34.0)
MCHC: 29.7 g/dL — AB (ref 30.0–36.0)
MCV: 74.1 fL — AB (ref 78.0–100.0)
Platelets: 309 10*3/uL (ref 150–400)
RBC: 4.28 MIL/uL (ref 3.87–5.11)
RDW: 13.7 % (ref 11.5–15.5)
WBC: 11.8 10*3/uL — ABNORMAL HIGH (ref 4.0–10.5)

## 2017-06-08 LAB — BASIC METABOLIC PANEL
Anion gap: 6 (ref 5–15)
BUN: 12 mg/dL (ref 6–20)
CALCIUM: 8.5 mg/dL — AB (ref 8.9–10.3)
CO2: 30 mmol/L (ref 22–32)
CREATININE: 0.73 mg/dL (ref 0.44–1.00)
Chloride: 101 mmol/L (ref 101–111)
GFR calc non Af Amer: >60 mL/min (ref >60)
GLUCOSE: 99 mg/dL (ref 65–99)
Potassium: 3.7 mmol/L (ref 3.5–5.1)
Sodium: 137 mmol/L (ref 135–145)

## 2017-06-08 LAB — HIV ANTIBODY (ROUTINE TESTING W REFLEX): HIV SCREEN 4TH GENERATION: NONREACTIVE

## 2017-06-08 MED ORDER — HYDROCODONE-ACETAMINOPHEN 5-325 MG PO TABS: 1.0000 | ORAL_TABLET | ORAL | 0 refills | Status: DC | PRN

## 2017-06-08 NOTE — Progress Notes (Signed)
Pt discharged in stable condition via wheelchair into the care of her husband via private vehicle. PIV removed intact and w/o complications. Discharge instructions reviewed with pt/husband.  Pt/husband verbalized understanding.

## 2017-06-08 NOTE — Discharge Summary (Signed)
Physician Discharge Summary  Patient ID: DEVON KINGDON MRN: [id number] DOB/AGE: 1960-08-03 57 y.o.  Admit date: 06/06/2017 Discharge date: 06/08/2017  Admission Diagnoses: Cholecystitis, cholelithiasis  Discharge Diagnoses:Same   Principal Problem:   Cholelithiasis Active Problems:   Anemia   Cholecystitis Same  Discharged Condition: good  Hospital Course: Patient is a 57 year old black female who presented to the hospital with worsening right upper quadrant abdominal pain.  She was found to have acute cholecystitis with cholelithiasis.  She underwent a laparoscopic cholecystectomy on 06/07/2017.  She tolerated procedure well.  Her postoperative course has been unremarkable.  Her diet was advanced without difficulty.  The patient is being discharged home on 06/08/2017 in good and improving condition.  Treatments: surgery: Laparoscopic cholecystectomy on 06/07/2017  Discharge Exam: Blood pressure 126/67, pulse 83, temperature 99.2 F (37.3 C), temperature source Oral, resp. rate 17, height 5\' 3"  (1.6 m), weight 176 lb 5.9 oz (80 kg), last menstrual period 11/19/2011, SpO2 97 %. General appearance: alert, cooperative and no distress Resp: clear to auscultation bilaterally Cardio: regular rate and rhythm, S1, S2 normal, no murmur, click, rub or gallop GI: Soft, dressings dry and intact.  Disposition: Discharge disposition: 01-Home or Self Care       Discharge Instructions    Diet - low sodium heart healthy   Complete by:  As directed    Increase activity slowly   Complete by:  As directed      Allergies as of 06/08/2017      Reactions   Bee Venom       Medication List    TAKE these medications   acetaminophen 500 MG tablet Commonly known as:  TYLENOL Take 500 mg by mouth every 6 (six) hours as needed.   EPIPEN 0.3 mg/0.3 mL Devi Generic drug:  EPINEPHrine Inject 0.3 mg into the muscle once.   HYDROcodone-acetaminophen 5-325 MG tablet Commonly known as:   NORCO Take 1 tablet by mouth every 4 (four) hours as needed for moderate pain.   ondansetron 4 MG disintegrating tablet Commonly known as:  ZOFRAN ODT Take 1 tablet (4 mg total) by mouth every 8 (eight) hours as needed for nausea.      Follow-up Information    Aviva Signs, MD. Schedule an appointment as soon as possible for a visit on 06/16/2017.   Specialty:  General Surgery Contact information: [address] [address] [phone]           Signed: Aviva Signs 06/08/2017, 7:36 AM

## 2017-06-08 NOTE — Plan of Care (Signed)
Education complete.

## 2017-06-08 NOTE — Discharge Instructions (Signed)
Laparoscopic Cholecystectomy, Care After °This sheet gives you information about how to care for yourself after your procedure. Your health care provider may also give you more specific instructions. If you have problems or questions, contact your health care provider. °What can I expect after the procedure? °After the procedure, it is common to have: °· Pain at your incision sites. You will be given medicines to control this pain. °· Mild nausea or vomiting. °· Bloating and possible shoulder pain from the air-like gas that was used during the procedure. ° °Follow these instructions at home: °Incision care ° °· Follow instructions from your health care provider about how to take care of your incisions. Make sure you: °? Wash your hands with soap and water before you change your bandage (dressing). If soap and water are not available, use hand sanitizer. °? Change your dressing as told by your health care provider. °? Leave stitches (sutures), skin glue, or adhesive strips in place. These skin closures may need to be in place for 2 weeks or longer. If adhesive strip edges start to loosen and curl up, you may trim the loose edges. Do not remove adhesive strips completely unless your health care provider tells you to do that. °· Do not take baths, swim, or use a hot tub until your health care provider approves. Ask your health care provider if you can take showers. You may only be allowed to take sponge baths for bathing. °· Check your incision area every day for signs of infection. Check for: °? More redness, swelling, or pain. °? More fluid or blood. °? Warmth. °? Pus or a bad smell. °Activity °· Do not drive or use heavy machinery while taking prescription pain medicine. °· Do not lift anything that is heavier than 10 lb (4.5 kg) until your health care provider approves. °· Do not play contact sports until your health care provider approves. °· Do not drive for 24 hours if you were given a medicine to help you relax  (sedative). °· Rest as needed. Do not return to work or school until your health care provider approves. °General instructions °· Take over-the-counter and prescription medicines only as told by your health care provider. °· To prevent or treat constipation while you are taking prescription pain medicine, your health care provider may recommend that you: °? Drink enough fluid to keep your urine clear or pale yellow. °? Take over-the-counter or prescription medicines. °? Eat foods that are high in fiber, such as fresh fruits and vegetables, whole grains, and beans. °? Limit foods that are high in fat and processed sugars, such as fried and sweet foods. °Contact a health care provider if: °· You develop a rash. °· You have more redness, swelling, or pain around your incisions. °· You have more fluid or blood coming from your incisions. °· Your incisions feel warm to the touch. °· You have pus or a bad smell coming from your incisions. °· You have a fever. °· One or more of your incisions breaks open. °Get help right away if: °· You have trouble breathing. °· You have chest pain. °· You have increasing pain in your shoulders. °· You faint or feel dizzy when you stand. °· You have severe pain in your abdomen. °· You have nausea or vomiting that lasts for more than one day. °· You have leg pain. °This information is not intended to replace advice given to you by your health care provider. Make sure you discuss any questions you   have with your health care provider. °Document Released: 12/28/2004 Document Revised: 07/19/2015 Document Reviewed: 06/16/2015 °Elsevier Interactive Patient Education © 2018 [employer] ° °

## 2017-06-16 ENCOUNTER — Ambulatory Visit (INDEPENDENT_AMBULATORY_CARE_PROVIDER_SITE_OTHER): Payer: Self-pay | Admitting: General Surgery

## 2017-06-16 ENCOUNTER — Encounter: Payer: Self-pay | Admitting: General Surgery

## 2017-06-16 VITALS — BP 138/70 | HR 71 | Temp 98.0°F | Resp 18 | Wt 174.0 lb

## 2017-06-16 DIAGNOSIS — Z09 Encounter for follow-up examination after completed treatment for conditions other than malignant neoplasm: Secondary | ICD-10-CM

## 2017-06-16 NOTE — Progress Notes (Signed)
Subjective:     Amy Parrish  Status post laparoscopic cholecystectomy.  Doing well.  Minimal incisional pain.  Denies any fever or chills. Objective:    BP 138/70 (BP Location: Left Arm, Patient Position: Sitting, Cuff Size: Normal)   Pulse 71   Temp 98 F (36.7 C) (Temporal)   Resp 18   Wt 174 lb (78.9 kg)   LMP 11/19/2011   BMI 30.82 kg/m   General:  alert, cooperative and no distress  Abdomen soft, incisions healing well.  Staples removed, Steri-Strips applied. Final pathology consistent with diagnosis.     Assessment:    Doing well postoperatively.    Plan:   Increase activity as able.  May return to work without restrictions on 07/04/2017.  Follow-up here as needed.

## 2019-03-18 ENCOUNTER — Ambulatory Visit: Payer: BC Managed Care – PPO | Attending: Internal Medicine

## 2019-03-18 DIAGNOSIS — Z23 Encounter for immunization: Secondary | ICD-10-CM | POA: Insufficient documentation

## 2019-03-18 NOTE — Progress Notes (Signed)
   Covid-19 Vaccination Clinic  Name:  Amy Parrish    MRN: [id number] DOB: [birth year]  03/18/2019  [name] was observed post Covid-19 immunization for 30 minutes based on pre-vaccination screening without incident. She was provided with Vaccine Information Sheet and instruction to access the V-Safe system.   [name] was instructed to call 911 with any severe reactions post vaccine: Marland Kitchen Difficulty breathing  . Swelling of face and throat  . A fast heartbeat  . A bad rash all over body  . Dizziness and weakness   Immunizations Administered    Name Date Dose VIS Date Route   Pfizer COVID-19 Vaccine 03/18/2019  6:34 PM 0.3 mL 12/22/2018 Intramuscular   Manufacturer: [place]   Lot: [id number]   [place]: [id number]

## 2019-04-08 ENCOUNTER — Ambulatory Visit: Payer: BC Managed Care – PPO | Attending: Internal Medicine

## 2019-04-08 DIAGNOSIS — Z23 Encounter for immunization: Secondary | ICD-10-CM

## 2019-04-08 NOTE — Progress Notes (Signed)
   Covid-19 Vaccination Clinic  Name:  Amy Parrish    MRN: [id number] DOB: [birth year]  04/08/2019  Amy Parrish was observed post Covid-19 immunization for 15 minutes without incident. She was provided with Vaccine Information Sheet and instruction to access the V-Safe system.   Amy Parrish was instructed to call 911 with any severe reactions post vaccine: Marland Kitchen Difficulty breathing  . Swelling of face and throat  . A fast heartbeat  . A bad rash all over body  . Dizziness and weakness   Immunizations Administered    Name Date Dose VIS Date Route   Pfizer COVID-19 Vaccine 04/08/2019  4:45 PM 0.3 mL 12/22/2018 Intramuscular   Manufacturer: [place]   Lot: [id number]   [place]: [id number]

## 2019-07-06 IMAGING — CT CT ABD-PELV W/ CM
2 of 5 series · 17 of 46 positions shown, 19 images · IV contrast (iopamidol)
Comparison: None.

CLINICAL DATA: Acute generalized abdominal pain, nausea, vomiting.

EXAM:
CT ABDOMEN AND PELVIS WITH CONTRAST
TECHNIQUE: Multidetector CT imaging of the abdomen and pelvis was performed
using the standard protocol following bolus administration of
intravenous contrast.
CONTRAST:  100mL HWIZ86-ZEE IOPAMIDOL (HWIZ86-ZEE) INJECTION 61%

[Series 2: axial st · axial · 0.70mm/px · z∈[+1000,+1395]mm · 14 of 89 slices shown, 16 images]
[im 5/89  soft-tissue]
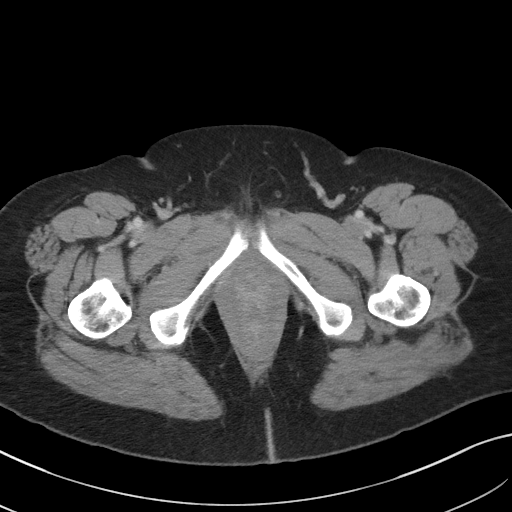
[im 5/89  bone]
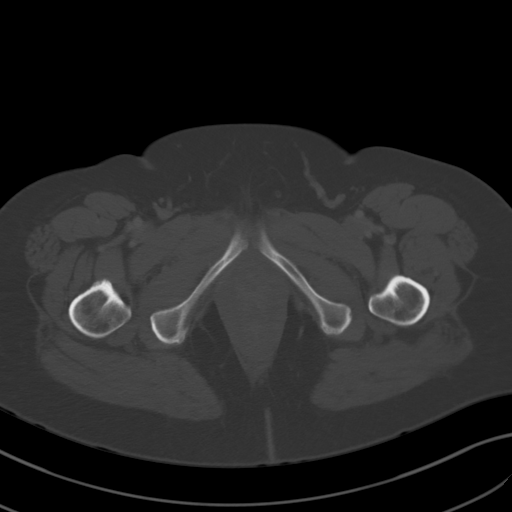
[im 10/89  soft-tissue]
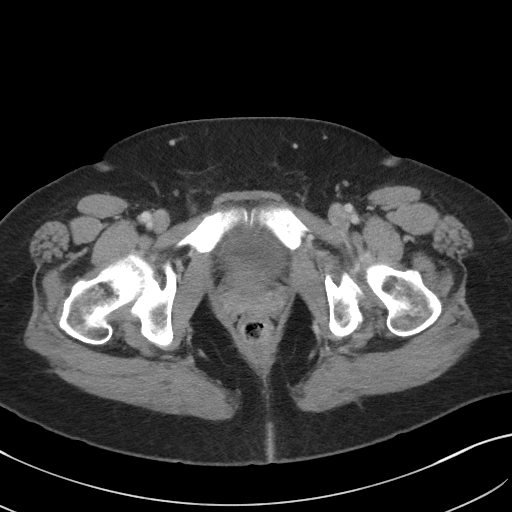
[im 19/89  soft-tissue]
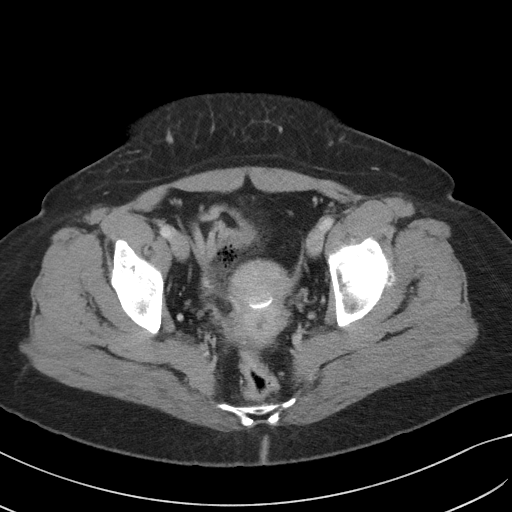
[im 24/89  soft-tissue]
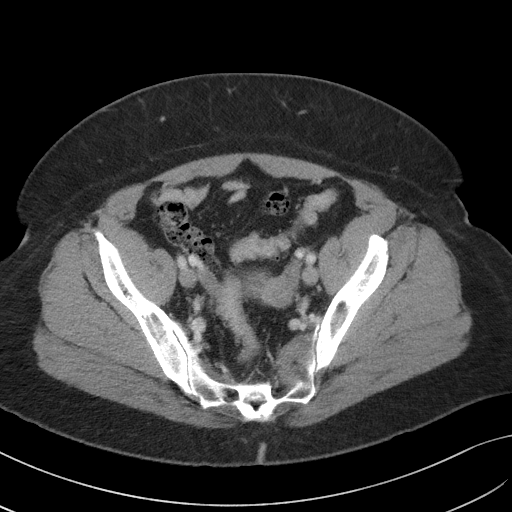
[im 28/89  soft-tissue]
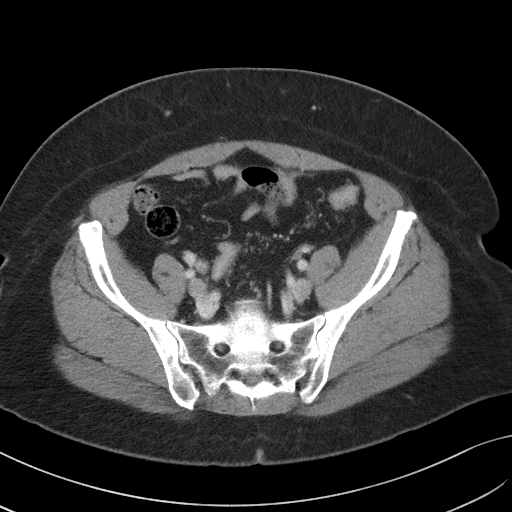
[im 38/89  soft-tissue]
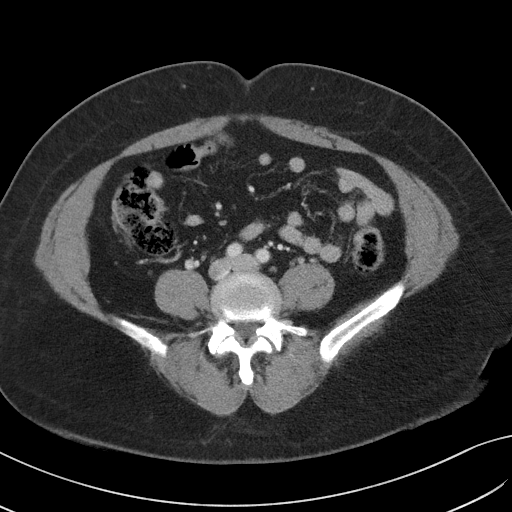
[im 42/89  soft-tissue]
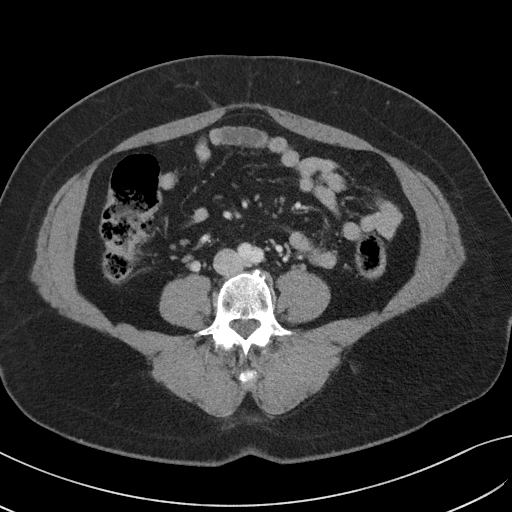
[im 47/89  soft-tissue]
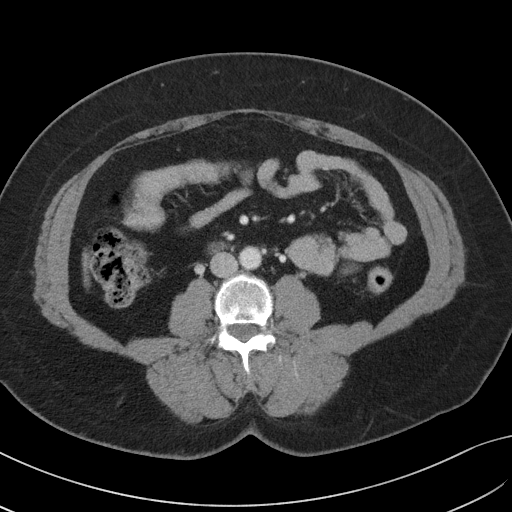
[im 51/89  soft-tissue]
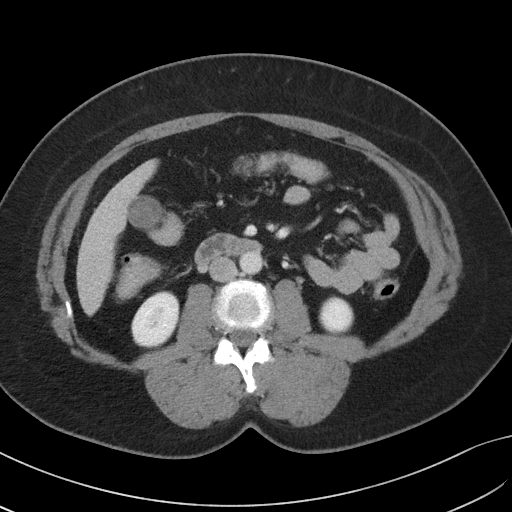
[im 51/89  bone]
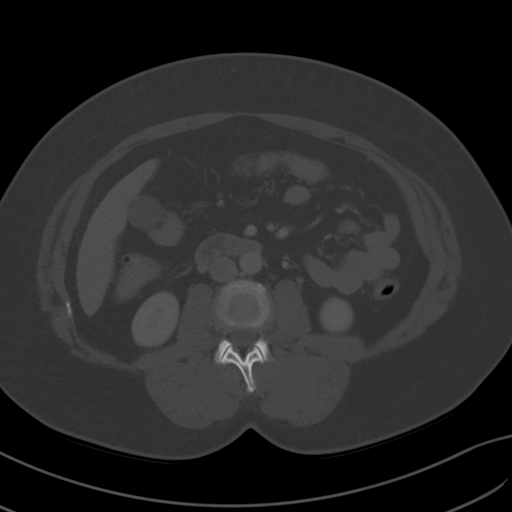
[im 61/89  soft-tissue]
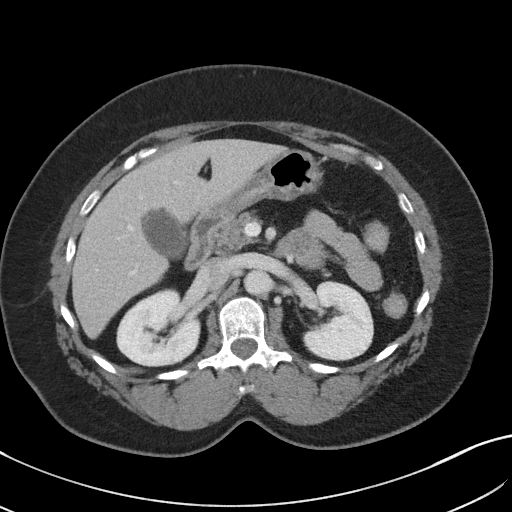
[im 65/89  soft-tissue]
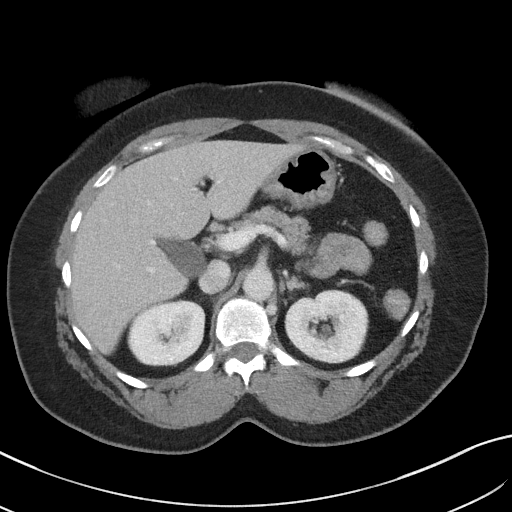
[im 70/89  soft-tissue]
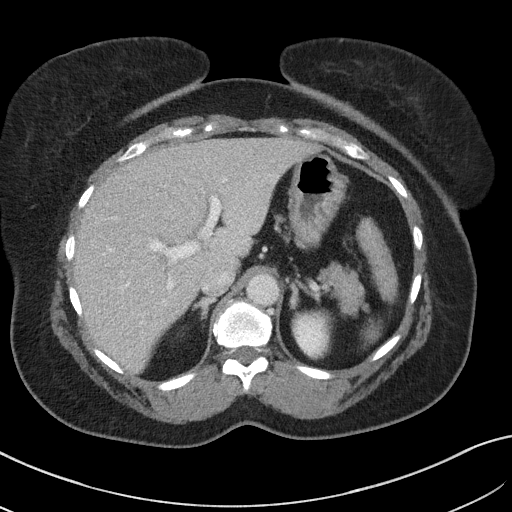
[im 79/89  soft-tissue]
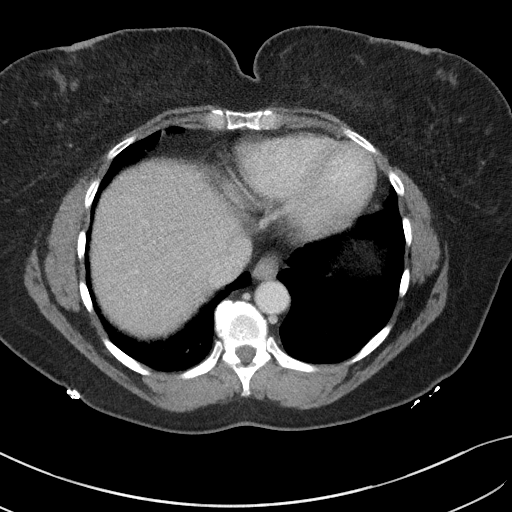
[im 84/89  soft-tissue]
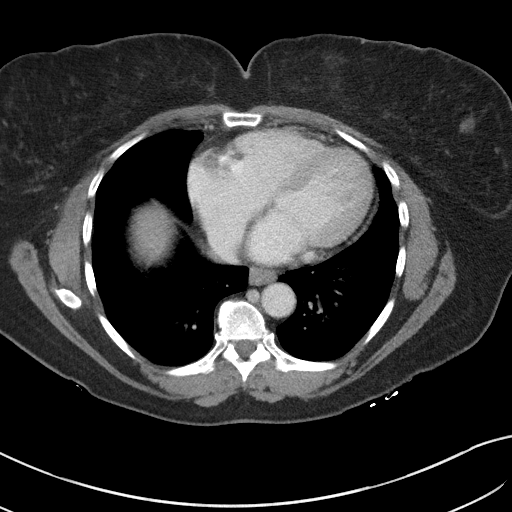

[Series 6: coronal st · coronal · 0.78mm/px · 3 of 115 slices shown]
[im 39/115  soft-tissue]
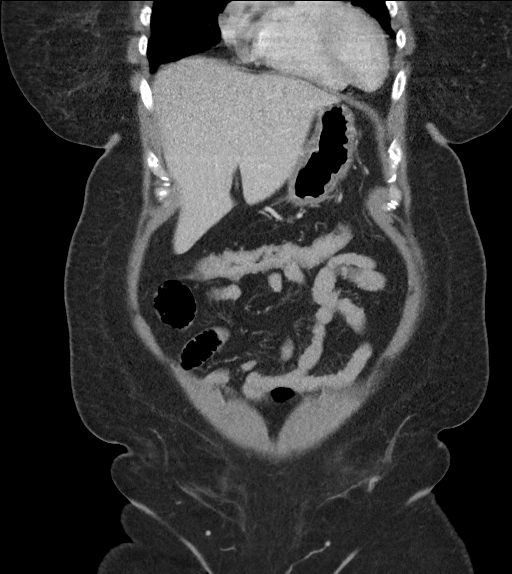
[im 51/115  soft-tissue]
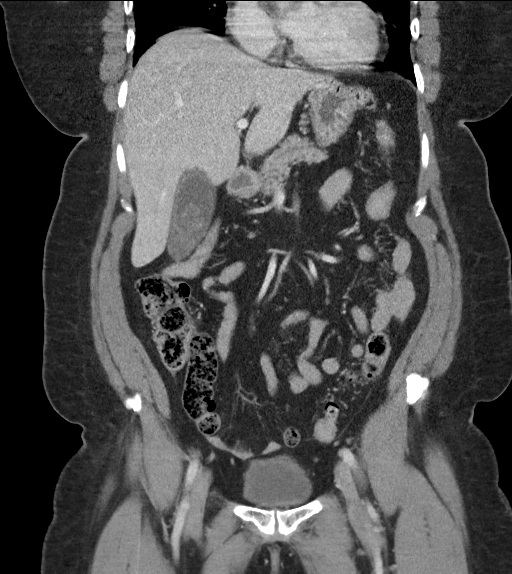
[im 64/115  soft-tissue]
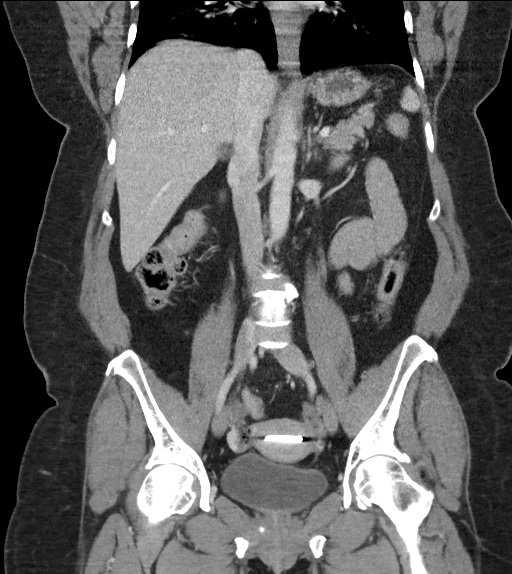

[17 of 46 positions shown; findings below may reference images not displayed]

FINDINGS: Lower chest: No acute abnormality.

Hepatobiliary: Cholelithiasis without inflammation. Liver appears
normal. No biliary dilatation is noted.

Pancreas: Unremarkable. No pancreatic ductal dilatation or
surrounding inflammatory changes.

Spleen: Normal in size without focal abnormality.

Adrenals/Urinary Tract: Adrenal glands appear normal. Small right
renal cyst is noted. No hydronephrosis or renal obstruction is
noted. No renal or ureteral calculi are noted. Urinary bladder is
unremarkable.

Stomach/Bowel: Stomach is within normal limits. Appendix appears
normal. No evidence of bowel wall thickening, distention, or
inflammatory changes.

Vascular/Lymphatic: No significant vascular findings are present. No
enlarged abdominal or pelvic lymph nodes.

Reproductive: Intrauterine device is noted. No adnexal abnormality
is noted.

Other: No abdominal wall hernia or abnormality. No abdominopelvic
ascites.

Musculoskeletal: No acute or significant osseous findings.
IMPRESSION: Cholelithiasis.

No other abnormality seen in the abdomen or pelvis.

## 2019-09-06 ENCOUNTER — Encounter: Payer: BC Managed Care – PPO | Admitting: Obstetrics & Gynecology

## 2020-01-16 ENCOUNTER — Encounter: Payer: Self-pay | Admitting: General Surgery

## 2020-01-17 ENCOUNTER — Other Ambulatory Visit: Payer: 59

## 2020-01-17 DIAGNOSIS — Z20822 Contact with and (suspected) exposure to covid-19: Secondary | ICD-10-CM

## 2020-01-18 LAB — NOVEL CORONAVIRUS, NAA: SARS-CoV-2, NAA: NOT DETECTED

## 2020-01-18 LAB — SARS-COV-2, NAA 2 DAY TAT

## 2020-07-08 ENCOUNTER — Ambulatory Visit (HOSPITAL_COMMUNITY)
Admission: RE | Admit: 2020-07-08 | Discharge: 2020-07-08 | Disposition: A | Discharge status: Home / Self Care | Payer: BC Managed Care – PPO | Source: Ambulatory Visit | Attending: Internal Medicine | Admitting: Internal Medicine

## 2020-07-08 ENCOUNTER — Other Ambulatory Visit (HOSPITAL_COMMUNITY): Payer: Self-pay | Admitting: Internal Medicine

## 2020-07-08 ENCOUNTER — Other Ambulatory Visit: Payer: Self-pay

## 2020-07-08 DIAGNOSIS — M79671 Pain in right foot: Secondary | ICD-10-CM | POA: Insufficient documentation

## 2020-08-04 ENCOUNTER — Ambulatory Visit (INDEPENDENT_AMBULATORY_CARE_PROVIDER_SITE_OTHER): Payer: BC Managed Care – PPO | Admitting: Obstetrics & Gynecology

## 2020-08-04 ENCOUNTER — Other Ambulatory Visit (HOSPITAL_COMMUNITY)
Admission: RE | Admit: 2020-08-04 | Discharge: 2020-08-04 | Disposition: A | Discharge status: Home / Self Care | Payer: BC Managed Care – PPO | Source: Ambulatory Visit | Attending: Obstetrics & Gynecology | Admitting: Obstetrics & Gynecology

## 2020-08-04 ENCOUNTER — Encounter: Payer: Self-pay | Admitting: Obstetrics & Gynecology

## 2020-08-04 ENCOUNTER — Other Ambulatory Visit: Payer: Self-pay

## 2020-08-04 VITALS — BP 140/80 | HR 69 | Ht 63.0 in | Wt 184.8 lb

## 2020-08-04 DIAGNOSIS — T8332XA Displacement of intrauterine contraceptive device, initial encounter: Secondary | ICD-10-CM | POA: Diagnosis not present

## 2020-08-04 DIAGNOSIS — Z124 Encounter for screening for malignant neoplasm of cervix: Secondary | ICD-10-CM | POA: Diagnosis present

## 2020-08-04 NOTE — Progress Notes (Signed)
WELL-WOMAN EXAMINATION Patient name: Amy Parrish MRN [id number]  Date of birth: [birth year] Chief Complaint:   Gynecologic Exam  History of Present Illness:   Amy Parrish is a 60 y.o. PM female who presents for gyn exam.  Pt being seen by PCP who completes other preventive measures.  Of note, pt reports that her IUD is still in place, which was about 71yr ago   Patient's last menstrual period was 11/19/2011. No bleeding since going through menopause in 2013 The current method of family planning is IUD.    Last pap several years ago.  Last mammogram: outside facility 08/2019. Last colonoscopy 10/2010  [facility]  Decreased Interest 2  Down, Depressed, Hopeless 0  PHQ - 2 Score 2  Altered sleeping 1  Tired, decreased energy 1  Change in appetite 0  Feeling bad or failure about yourself  0  Trouble concentrating 0  Moving slowly or fidgety/restless 0  Suicidal thoughts 0  PHQ-9 Score 4      Review of Systems:   Pertinent items are noted in HPI Denies any headaches, blurred vision, fatigue, shortness of breath, chest pain, abdominal pain, bowel movements, urination, or intercourse unless otherwise stated above.  Pertinent History Reviewed:  Reviewed past medical,surgical, social and family history.  Reviewed problem list, medications and allergies. Physical Assessment:   Vitals:   08/04/20 1116  BP: 140/80  Pulse: 69  Weight: 184 lb 12.8 oz (83.8 kg)  Height: '5\' 3"'$  (1.6 m)  Body mass index is 32.74 kg/m.        Physical Examination:   General appearance - well appearing, and in no distress  Mental status - alert, oriented to person, place, and time  Psych:  She has a normal mood and affect  Skin - warm and dry, normal color, no suspicious lesions noted  Chest - effort normal, all lung fields clear to auscultation bilaterally  Heart - normal rate and regular rhythm  Neck:  midline trachea, no thyromegaly or nodules  Breasts -  breasts appear normal, no suspicious masses, no skin or nipple changes or  axillary nodes  Abdomen - soft, nontender, nondistended, no masses or organomegaly  Pelvic - VULVA: normal appearing vulva with no masses, tenderness or lesions  VAGINA: normal appearing vagina with normal color and discharge, no lesions  CERVIX: normal appearing cervix without discharge or lesions, no CMT  Thin prep pap is done with HR HPV cotesting  UTERUS: uterus is felt to be normal size, shape, consistency and nontender, strings visualized at cervical os  ADNEXA: No adnexal masses or tenderness noted.  Extremities:  No swelling or varicosities noted  Chaperone: Angel Neas     IUD REMOVAL: A sterile speculum was placed in the vagina.  The cervix was visualized, and the white strings visible. They were grasped and immediately broke off from device.  The IUD was not visible and could not be removed without the attached strings.  The patient tolerated the procedure well.    Assessment & Plan:  1) Cervical cancer screening -pap obtained -encouraged to continue with mammogram yearly- per pt being done in [place] 2) IUD migration -unable to remove IUD as strings detached from device -discussed ideally device should be removed as this is a foreign object; however it has not caused any problems or concerns for patient -will plan to review potential cost and consider surgical removal  Meds: No orders of the defined types were placed in this encounter.  Follow-up: Return in about 1 year (around 08/04/2021) for Annual.   Amy Pupa, DO Attending [place], [place], [place]

## 2020-08-08 LAB — CYTOLOGY - PAP
Comment: NEGATIVE
Diagnosis: NEGATIVE
High risk HPV: NEGATIVE

## 2020-09-11 ENCOUNTER — Encounter: Payer: Self-pay | Admitting: *Deleted

## 2020-09-30 NOTE — Patient Instructions (Signed)
Your procedure is scheduled on: 10/07/2020  Report to [facility]  at    11:30 AM.  Call this number if you have problems the morning of surgery: [phone]   Remember:   Do not Eat or Drink after midnight         No Smoking the morning of surgery  :  Take these medicines the morning of surgery with A SIP OF WATER: Zyrtec   Do not wear jewelry, make-up or nail polish.  Do not wear lotions, powders, or perfumes. You may wear deodorant.  Do not shave 48 hours prior to surgery. Men may shave face and neck.  Do not bring valuables to the hospital.  Contacts, dentures or bridgework may not be worn into surgery.  Leave suitcase in the car. After surgery it may be brought to your room.  For patients admitted to the hospital, checkout time is 11:00 AM the day of discharge.   Patients discharged the day of surgery will not be allowed to drive home.    Special Instructions: Shower using CHG night before surgery and shower the day of surgery use CHG.  Use special wash - you have one bottle of CHG for all showers.  You should use approximately 1/2 of the bottle for each shower.  How to Use Chlorhexidine for Bathing Chlorhexidine gluconate (CHG) is a germ-killing (antiseptic) solution that is used to clean the skin. It can get rid of the bacteria that normally live on the skin and can keep them away for about 24 hours. To clean your skin with CHG, you may be given: A CHG solution to use in the shower or as part of a sponge bath. A prepackaged cloth that contains CHG. Cleaning your skin with CHG may help lower the risk for infection: While you are staying in the intensive care unit of the hospital. If you have a vascular access, such as a central line, to provide short-term or long-term access to your veins. If you have a catheter to drain urine from your bladder. If you are on a ventilator. A ventilator is a machine that helps you breathe by moving air in and out of your lungs. After  surgery. What are the risks? Risks of using CHG include: A skin reaction. Hearing loss, if CHG gets in your ears and you have a perforated eardrum. Eye injury, if CHG gets in your eyes and is not rinsed out. The CHG product catching fire. Make sure that you avoid smoking and flames after applying CHG to your skin. Do not use CHG: If you have a chlorhexidine allergy or have previously reacted to chlorhexidine. On babies younger than 69 months of age. How to use CHG solution Use CHG only as told by your health care provider, and follow the instructions on the label. Use the full amount of CHG as directed. Usually, this is one bottle. During a shower Follow these steps when using CHG solution during a shower (unless your health care provider gives you different instructions): Start the shower. Use your normal soap and shampoo to wash your face and hair. Turn off the shower or move out of the shower stream. Pour the CHG onto a clean washcloth. Do not use any type of brush or rough-edged sponge. Starting at your neck, lather your body down to your toes. Make sure you follow these instructions: If you will be having surgery, pay special attention to the part of your body where you will be having surgery. Scrub this  area for at least 1 minute. Do not use CHG on your head or face. If the solution gets into your ears or eyes, rinse them well with water. Avoid your genital area. Avoid any areas of skin that have broken skin, cuts, or scrapes. Scrub your back and under your arms. Make sure to wash skin folds. Let the lather sit on your skin for 1-2 minutes or as long as told by your health care provider. Thoroughly rinse your entire body in the shower. Make sure that all body creases and crevices are rinsed well. Dry off with a clean towel. Do not put any substances on your body afterward--such as powder, lotion, or perfume--unless you are told to do so by your health care provider. Only use lotions  that are recommended by the manufacturer. Put on clean clothes or pajamas. If it is the night before your surgery, sleep in clean sheets.  During a sponge bath Follow these steps when using CHG solution during a sponge bath (unless your health care provider gives you different instructions): Use your normal soap and shampoo to wash your face and hair. Pour the CHG onto a clean washcloth. Starting at your neck, lather your body down to your toes. Make sure you follow these instructions: If you will be having surgery, pay special attention to the part of your body where you will be having surgery. Scrub this area for at least 1 minute. Do not use CHG on your head or face. If the solution gets into your ears or eyes, rinse them well with water. Avoid your genital area. Avoid any areas of skin that have broken skin, cuts, or scrapes. Scrub your back and under your arms. Make sure to wash skin folds. Let the lather sit on your skin for 1-2 minutes or as long as told by your health care provider. Using a different clean, wet washcloth, thoroughly rinse your entire body. Make sure that all body creases and crevices are rinsed well. Dry off with a clean towel. Do not put any substances on your body afterward--such as powder, lotion, or perfume--unless you are told to do so by your health care provider. Only use lotions that are recommended by the manufacturer. Put on clean clothes or pajamas. If it is the night before your surgery, sleep in clean sheets. How to use CHG prepackaged cloths Only use CHG cloths as told by your health care provider, and follow the instructions on the label. Use the CHG cloth on clean, dry skin. Do not use the CHG cloth on your head or face unless your health care provider tells you to. When washing with the CHG cloth: Avoid your genital area. Avoid any areas of skin that have broken skin, cuts, or scrapes. Before surgery Follow these steps when using a CHG cloth to  clean before surgery (unless your health care provider gives you different instructions): Using the CHG cloth, vigorously scrub the part of your body where you will be having surgery. Scrub using a back-and-forth motion for 3 minutes. The area on your body should be completely wet with CHG when you are done scrubbing. Do not rinse. Discard the cloth and let the area air-dry. Do not put any substances on the area afterward, such as powder, lotion, or perfume. Put on clean clothes or pajamas. If it is the night before your surgery, sleep in clean sheets.  For general bathing Follow these steps when using CHG cloths for general bathing (unless your health care provider gives  you different instructions). Use a separate CHG cloth for each area of your body. Make sure you wash between any folds of skin and between your fingers and toes. Wash your body in the following order, switching to a new cloth after each step: The front of your neck, shoulders, and chest. Both of your arms, under your arms, and your hands. Your stomach and groin area, avoiding the genitals. Your right leg and foot. Your left leg and foot. The back of your neck, your back, and your buttocks. Do not rinse. Discard the cloth and let the area air-dry. Do not put any substances on your body afterward--such as powder, lotion, or perfume--unless you are told to do so by your health care provider. Only use lotions that are recommended by the manufacturer. Put on clean clothes or pajamas. Contact a health care provider if: Your skin gets irritated after scrubbing. You have questions about using your solution or cloth. You swallow any chlorhexidine. Call your local poison control center (1-289-376-1439 in the [place] Get help right away if: Your eyes itch badly, or they become very red or swollen. Your skin itches badly and is red or swollen. Your hearing changes. You have trouble seeing. You have swelling or tingling in your mouth or  throat. You have trouble breathing. These symptoms may represent a serious problem that is an emergency. Do not wait to see if the symptoms will go away. Get medical help right away. Call your local emergency services (911 in the [place] Do not drive yourself to the hospital. Summary Chlorhexidine gluconate (CHG) is a germ-killing (antiseptic) solution that is used to clean the skin. Cleaning your skin with CHG may help to lower your risk for infection. You may be given CHG to use for bathing. It may be in a bottle or in a prepackaged cloth to use on your skin. Carefully follow your health care provider's instructions and the instructions on the product label. Do not use CHG if you have a chlorhexidine allergy. Contact your health care provider if your skin gets irritated after scrubbing. This information is not intended to replace advice given to you by your health care provider. Make sure you discuss any questions you have with your health care provider. Document Revised: 03/10/2020 Document Reviewed: 03/10/2020 Elsevier Patient Education  2022 [employer]. Hysteroscopy Hysteroscopy is a procedure used to look inside a woman's womb (uterus). This may be done for various reasons, including: To look for tumors and other growths in the uterus. To evaluate abnormal bleeding, fibroid tumors, polyps, scar tissue, or uterine cancer. To determine why a woman is unable to get pregnant or has had repeated pregnancy losses. To locate an IUD (intrauterine device). To place a birth control device into the fallopian tubes. During this procedure, a thin, flexible tube with a small light and camera (hysteroscope) is used to examine the uterus. The camera sends images to a monitor in the room so that your health care provider can view the inside of your uterus. A hysteroscopy should be done right after a menstrual period. Tell a health care provider about: Any allergies you have. All medicines you are taking,  including vitamins, herbs, eye drops, creams, and over-the-counter medicines. Any problems you or family members have had with anesthetic medicines. Any blood disorders you have. Any surgeries you have had. Any medical conditions you have. Whether you are pregnant or may be pregnant. Whether you have been diagnosed with an STI (sexually transmitted infection) or you think you  have an STI. What are the risks? Generally, this is a safe procedure. However, problems may occur, including: Excessive bleeding. Infection. Damage to the uterus or other structures or organs. Allergic reaction to medicines or fluids that are used in the procedure. What happens before the procedure? Staying hydrated Follow instructions from your health care provider about hydration, which may include: Up to 2 hours before the procedure - you may continue to drink clear liquids, such as water, clear fruit juice, black coffee, and plain tea. Eating and drinking restrictions Follow instructions from your health care provider about eating and drinking, which may include: 8 hours before the procedure - stop eating solid foods and drink clear liquids only. 2 hours before the procedure - stop drinking clear liquids. Medicines Ask your health care provider about: Changing or stopping your regular medicines. This is especially important if you are taking diabetes medicines or blood thinners. Taking medicines such as aspirin and ibuprofen. These medicines can thin your blood. Do not take these medicines unless your health care provider tells you to take them. Taking over-the-counter medicines, vitamins, herbs, and supplements. Medicine may be placed in your cervix the day before the procedure. This medicine causes the cervix to open (dilate). The larger opening makes it easier for the hysteroscope to be inserted into the uterus during the procedure. General instructions Ask your health care provider: What steps will be taken  to help prevent infection. These steps may include: Washing skin with a germ-killing soap. Taking antibiotic medicine. Do not use any products that contain nicotine or tobacco for at least 4 weeks before the procedure. These products include cigarettes, chewing tobacco, and vaping devices, such as e-cigarettes. If you need help quitting, ask your health care provider. Plan to have a responsible adult take you home from the hospital or clinic. Plan to have a responsible adult care for you for the time you are told after you leave the hospital or clinic. This is important. Empty your bladder before the procedure begins. What happens during the procedure? An IV will be inserted into one of your veins. You may be given: A medicine to help you relax (sedative). A medicine that numbs the area around the cervix (local anesthetic). A medicine to make you fall asleep (general anesthetic). A hysteroscope will be inserted through your vagina and into your uterus. Air or fluid will be used to enlarge your uterus to allow your health care provider to see it better. The amount of fluid used will be carefully checked throughout the procedure. In some cases, tissue may be gently scraped from inside the uterus and sent to a lab for testing (biopsy). The procedure may vary among health care providers and hospitals. What can I expect after the procedure? Your blood pressure, heart rate, breathing rate, and blood oxygen level will be monitored until you leave the hospital or clinic. You may have cramps. You may be given medicines for this. You may have bleeding, which may vary from light spotting to menstrual-like bleeding. This is normal. If you had a biopsy, it is up to you to get the results. Ask your health care provider, or the department that is doing the procedure, when your results will be ready. Follow these instructions at home: Activity Rest as told by your health care provider. Return to your normal  activities as told by your health care provider. Ask your health care provider what activities are safe for you. If you were given a sedative during the procedure,  it can affect you for several hours. Do not drive or operate machinery until your health care provider says that it is safe. Medicines Do not take aspirin or other NSAIDs during recovery, as told by your healthcare provider. It can increase the risk of bleeding. Ask your health care provider if the medicine prescribed to you: Requires you to avoid driving or using machinery. Can cause constipation. You may need to take these actions to prevent or treat constipation: Drink enough fluid to keep your urine pale yellow. Take over-the-counter or prescription medicines. Eat foods that are high in fiber, such as beans, whole grains, and fresh fruits and vegetables. Limit foods that are high in fat and processed sugars, such as fried or sweet foods. General instructions Do not douche, use tampons, or have sex for 2 weeks after the procedure, or until your health care provider approves. Do not take baths, swim, or use a hot tub until your health care provider approves. Take showers instead of baths for 2 weeks, or for as long as told by your health care provider. Keep all follow-up visits. This is important. Contact a health care provider if: You feel dizzy or lightheaded. You feel nauseous. You have abnormal vaginal discharge. You have a rash. You have pain that does not get better with medicine. You have chills. Get help right away if: You have bleeding that is heavier than a normal menstrual period. You have a fever. You have pain or cramps that get worse. You develop new abdominal pain. You faint. You have pain in your shoulder. You are short of breath. Summary Hysteroscopy is a procedure that is used to look inside a woman's womb (uterus). After the procedure, you may have bleeding, which varies from light spotting to  menstrual-like bleeding. This is normal. You may also have cramps. Do not douche, use tampons, or have sex for 2 weeks after the procedure, or until your health care provider approves. Plan to have a responsible adult take you home from the hospital or clinic. This information is not intended to replace advice given to you by your health care provider. Make sure you discuss any questions you have with your health care provider. Document Revised: 08/15/2019 Document Reviewed: 08/15/2019 Elsevier Patient Education  [place] Anesthesia, Adult, Care After This sheet gives you information about how to care for yourself after your procedure. Your health care provider may also give you more specific instructions. If you have problems or questions, contact your health care provider. What can I expect after the procedure? After the procedure, the following side effects are common: Pain or discomfort at the IV site. Nausea. Vomiting. Sore throat. Trouble concentrating. Feeling cold or chills. Feeling weak or tired. Sleepiness and fatigue. Soreness and body aches. These side effects can affect parts of the body that were not involved in surgery. Follow these instructions at home: For the time period you were told by your health care provider:  Rest. Do not participate in activities where you could fall or become injured. Do not drive or use machinery. Do not drink alcohol. Do not take sleeping pills or medicines that cause drowsiness. Do not make important decisions or sign legal documents. Do not take care of children on your own. Eating and drinking Follow any instructions from your health care provider about eating or drinking restrictions. When you feel hungry, start by eating small amounts of foods that are soft and easy to digest (bland), such as toast. Gradually return to  your regular diet. Drink enough fluid to keep your urine pale yellow. If you vomit, rehydrate by  drinking water, juice, or clear broth. General instructions If you have sleep apnea, surgery and certain medicines can increase your risk for breathing problems. Follow instructions from your health care provider about wearing your sleep device: Anytime you are sleeping, including during daytime naps. While taking prescription pain medicines, sleeping medicines, or medicines that make you drowsy. Have a responsible adult stay with you for the time you are told. It is important to have someone help care for you until you are awake and alert. Return to your normal activities as told by your health care provider. Ask your health care provider what activities are safe for you. Take over-the-counter and prescription medicines only as told by your health care provider. If you smoke, do not smoke without supervision. Keep all follow-up visits as told by your health care provider. This is important. Contact a health care provider if: You have nausea or vomiting that does not get better with medicine. You cannot eat or drink without vomiting. You have pain that does not get better with medicine. You are unable to pass urine. You develop a skin rash. You have a fever. You have redness around your IV site that gets worse. Get help right away if: You have difficulty breathing. You have chest pain. You have blood in your urine or stool, or you vomit blood. Summary After the procedure, it is common to have a sore throat or nausea. It is also common to feel tired. Have a responsible adult stay with you for the time you are told. It is important to have someone help care for you until you are awake and alert. When you feel hungry, start by eating small amounts of foods that are soft and easy to digest (bland), such as toast. Gradually return to your regular diet. Drink enough fluid to keep your urine pale yellow. Return to your normal activities as told by your health care provider. Ask your health care  provider what activities are safe for you. This information is not intended to replace advice given to you by your health care provider. Make sure you discuss any questions you have with your health care provider. Document Revised: 09/13/2019 Document Reviewed: 04/12/2019 Elsevier Patient Education  2022 [employer].

## 2020-10-03 ENCOUNTER — Other Ambulatory Visit: Payer: Self-pay

## 2020-10-03 ENCOUNTER — Encounter (HOSPITAL_COMMUNITY)
Admission: RE | Admit: 2020-10-03 | Discharge: 2020-10-03 | Disposition: A | Discharge status: Home / Self Care | Payer: BC Managed Care – PPO | Source: Ambulatory Visit | Attending: Obstetrics & Gynecology | Admitting: Obstetrics & Gynecology

## 2020-10-03 ENCOUNTER — Encounter (HOSPITAL_COMMUNITY): Payer: Self-pay

## 2020-10-03 DIAGNOSIS — Z01812 Encounter for preprocedural laboratory examination: Secondary | ICD-10-CM | POA: Insufficient documentation

## 2020-10-03 LAB — BASIC METABOLIC PANEL
Anion gap: 8 (ref 5–15)
BUN: 12 mg/dL (ref 8–23)
CO2: 26 mmol/L (ref 22–32)
Calcium: 9 mg/dL (ref 8.9–10.3)
Chloride: 105 mmol/L (ref 98–111)
Creatinine, Ser: 0.7 mg/dL (ref 0.44–1.00)
GFR, Estimated: >60 mL/min (ref >60)
Glucose, Bld: 96 mg/dL (ref 70–99)
Potassium: 3.6 mmol/L (ref 3.5–5.1)
Sodium: 139 mmol/L (ref 135–145)

## 2020-10-03 LAB — CBC
HCT: 38.2 % (ref 36.0–46.0)
Hemoglobin: 11.4 g/dL — ABNORMAL LOW (ref 12.0–15.0)
MCH: 22.8 pg — ABNORMAL LOW (ref 26.0–34.0)
MCHC: 29.8 g/dL — ABNORMAL LOW (ref 30.0–36.0)
MCV: 76.4 fL — ABNORMAL LOW (ref 80.0–100.0)
Platelets: 374 10*3/uL (ref 150–400)
RBC: 5 MIL/uL (ref 3.87–5.11)
RDW: 14.4 % (ref 11.5–15.5)
WBC: 5.9 10*3/uL (ref 4.0–10.5)
nRBC: 0 % (ref 0.0–0.2)

## 2020-10-06 NOTE — H&P (Signed)
Obstetrics and Gynecology Attending History and Physical  Amy Parrish is a 60 y.o. G1P1 postmenopausal female who presents for hysteroscopy, IUD removal.  Removal was attempted in office; however strings disconnected from device.  While the device has been in place for many years- options were reviewed with patient and she desires to have it removed.    Denies vaginal bleeding.  Denies pelvic pain.  Denies any abnormal vaginal discharge, fevers, chills, sweats, dysuria, nausea, vomiting, other GI or GU symptoms or other general symptoms.  Past Medical History:  Diagnosis Date   Anemia    seen by Dr. Tressie Stalker, records alpha thalassemia trait   Obesity    Osteoarthritis    Seasonal allergies    Past Surgical History:  Procedure Laterality Date   CHOLECYSTECTOMY N/A 06/07/2017   Procedure: LAPAROSCOPIC CHOLECYSTECTOMY;  Surgeon: Aviva Signs, MD;  Location: [facility] ORS;  Service: General;  Laterality: N/A;   COLONOSCOPY  10/16/2010   Procedure: COLONOSCOPY;  Surgeon: Dorothyann Peng, MD;  Location: [facility] ENDO SUITE;  Service: Endoscopy;  Laterality: N/A;  11:30   left hand surgery     OB History  Gravida Para Term Preterm AB Living  1 1       1   SAB IAB Ectopic Multiple Live Births          1    # Outcome Date GA Lbr Len/2nd Weight Sex Delivery Anes PTL Lv  1 Para      Vag-Spont       Obstetric Comments  4 adopted children, 3 children still @ home  Patient denies any other pertinent gynecologic issues.  No current facility-administered medications on file prior to encounter.   Current Outpatient Medications on File Prior to Encounter  Medication Sig Dispense Refill   acetaminophen (TYLENOL) 500 MG tablet Take 500-1,000 mg by mouth every 6 (six) hours as needed.     cetirizine (ZYRTEC) 10 MG tablet Take 10 mg by mouth daily as needed for allergies.     Misc Natural Products (JOINT HEALTH PO) Take 1 tablet by mouth every evening.     EPINEPHrine (EPI-PEN) 0.3 mg/0.3 mL DEVI Inject 0.3  mg into the muscle once.     Allergies  Allergen Reactions   Bee Venom Anaphylaxis    Social History:   reports that she has never smoked. She has never used smokeless tobacco. She reports that she does not drink alcohol and does not use drugs. Family History  Problem Relation Age of Onset   Diabetes Father    Hypertension Father    Hypertension Mother    Kidney failure Mother        early stages   Colon cancer Neg Hx     Review of Systems: Pertinent items noted in HPI and remainder of comprehensive ROS otherwise negative.  PHYSICAL EXAM: Last menstrual period 11/19/2011. CONSTITUTIONAL: Well-developed, well-nourished female in no acute distress.  NECK: Normal range of motion, supple, no masses SKIN: Skin is warm and dry. No rash noted. Not diaphoretic. No erythema. No pallor. PSYCHIATRIC: Normal mood and affect. Normal behavior. Normal judgment and thought content. CARDIOVASCULAR: Normal heart rate noted, regular rhythm RESPIRATORY: Effort and breath sounds normal, no problems with respiration noted ABDOMEN: Soft, nontender, nondistended. PELVIC: deferred, perform in office MUSCULOSKELETAL: No edema  Labs: Results for orders placed or performed during the hospital encounter of 10/03/20 (from the past 336 hour(s))  CBC   Collection Time: 10/03/20  8:44 AM  Result Value Ref Range  WBC 5.9 4.0 - 10.5 K/uL   RBC 5.00 3.87 - 5.11 MIL/uL   Hemoglobin 11.4 (L) 12.0 - 15.0 g/dL   HCT 38.2 36.0 - 46.0 %   MCV 76.4 (L) 80.0 - 100.0 fL   MCH 22.8 (L) 26.0 - 34.0 pg   MCHC 29.8 (L) 30.0 - 36.0 g/dL   RDW 14.4 11.5 - 15.5 %   Platelets 374 150 - 400 K/uL   nRBC 0.0 0.0 - 0.2 %  Basic metabolic panel   Collection Time: 10/03/20  8:44 AM  Result Value Ref Range   Sodium 139 135 - 145 mmol/L   Potassium 3.6 3.5 - 5.1 mmol/L   Chloride 105 98 - 111 mmol/L   CO2 26 22 - 32 mmol/L   Glucose, Bld 96 70 - 99 mg/dL   BUN 12 8 - 23 mg/dL   Creatinine, Ser 0.70 0.44 - 1.00 mg/dL    Calcium 9.0 8.9 - 10.3 mg/dL   GFR, Estimated >60 >60 mL/min   Anion gap 8 5 - 15   Assessment: IUD migration   Plan: -NPO -LR @ 125cc/hr -SCDs to OR -Risk/benefit and alternatives reviewed with patient including but not limited to risk of bleeding, infection, uterine perforation and/or inability to remove the device.  Questions and concerns were addressed and she desires to proceed.  Janyth Pupa, DO Attending [place], [facility] for [employer], [place]

## 2020-10-07 ENCOUNTER — Ambulatory Visit (HOSPITAL_COMMUNITY): Payer: BC Managed Care – PPO | Admitting: Anesthesiology

## 2020-10-07 ENCOUNTER — Encounter (HOSPITAL_COMMUNITY): Payer: Self-pay | Admitting: Obstetrics & Gynecology

## 2020-10-07 ENCOUNTER — Other Ambulatory Visit: Payer: Self-pay

## 2020-10-07 ENCOUNTER — Encounter (HOSPITAL_COMMUNITY)
Admission: RE | Disposition: A | Discharge status: Home / Self Care | Payer: Self-pay | Source: Ambulatory Visit | Attending: Obstetrics & Gynecology

## 2020-10-07 ENCOUNTER — Ambulatory Visit (HOSPITAL_COMMUNITY)
Admission: RE | Admit: 2020-10-07 | Discharge: 2020-10-07 | Disposition: A | Discharge status: Home / Self Care | Payer: BC Managed Care – PPO | Source: Ambulatory Visit | Attending: Obstetrics & Gynecology | Admitting: Obstetrics & Gynecology

## 2020-10-07 DIAGNOSIS — Z91030 Bee allergy status: Secondary | ICD-10-CM | POA: Diagnosis not present

## 2020-10-07 DIAGNOSIS — Z30432 Encounter for removal of intrauterine contraceptive device: Secondary | ICD-10-CM | POA: Diagnosis not present

## 2020-10-07 DIAGNOSIS — Y762 Prosthetic and other implants, materials and accessory obstetric and gynecological devices associated with adverse incidents: Secondary | ICD-10-CM | POA: Diagnosis not present

## 2020-10-07 DIAGNOSIS — T8332XA Displacement of intrauterine contraceptive device, initial encounter: Secondary | ICD-10-CM | POA: Diagnosis present

## 2020-10-07 DIAGNOSIS — Z87892 Personal history of anaphylaxis: Secondary | ICD-10-CM | POA: Insufficient documentation

## 2020-10-07 DIAGNOSIS — T8332XD Displacement of intrauterine contraceptive device, subsequent encounter: Secondary | ICD-10-CM | POA: Diagnosis not present

## 2020-10-07 HISTORY — PX: IUD REMOVAL: SHX5392

## 2020-10-07 HISTORY — PX: HYSTEROSCOPY: SHX211

## 2020-10-07 SURGERY — HYSTEROSCOPY
Anesthesia: General | Site: Vagina

## 2020-10-07 MED ORDER — FENTANYL CITRATE (PF) 100 MCG/2ML IJ SOLN
INTRAMUSCULAR | Status: AC
  Filled 2020-10-07: qty 2

## 2020-10-07 MED ORDER — DEXMEDETOMIDINE (PRECEDEX) IN NS 20 MCG/5ML (4 MCG/ML) IV SYRINGE
PREFILLED_SYRINGE | INTRAVENOUS | Status: DC | PRN
  Administered 2020-10-07: 10 ug via INTRAVENOUS

## 2020-10-07 MED ORDER — LACTATED RINGERS IV SOLN: INTRAVENOUS | Status: DC

## 2020-10-07 MED ORDER — LACTATED RINGERS IV SOLN
INTRAVENOUS | Status: DC
  Administered 2020-10-07: 1000 mL via INTRAVENOUS

## 2020-10-07 MED ORDER — FENTANYL CITRATE PF 50 MCG/ML IJ SOSY: 25.0000 ug | PREFILLED_SYRINGE | INTRAMUSCULAR | Status: DC | PRN

## 2020-10-07 MED ORDER — CHLORHEXIDINE GLUCONATE 0.12 % MT SOLN
15.0000 mL | Freq: Once | OROMUCOSAL | Status: AC
  Administered 2020-10-07: 15 mL via OROMUCOSAL

## 2020-10-07 MED ORDER — PROPOFOL 10 MG/ML IV BOLUS
INTRAVENOUS | Status: AC
  Filled 2020-10-07: qty 20

## 2020-10-07 MED ORDER — PROPOFOL 500 MG/50ML IV EMUL
INTRAVENOUS | Status: DC | PRN
Start: 2020-10-07 — End: 2020-10-07
  Administered 2020-10-07: 60 ug/kg/min via INTRAVENOUS

## 2020-10-07 MED ORDER — MEPERIDINE HCL 50 MG/ML IJ SOLN: 6.2500 mg | INTRAMUSCULAR | Status: DC | PRN

## 2020-10-07 MED ORDER — PROPOFOL 10 MG/ML IV BOLUS
INTRAVENOUS | Status: DC | PRN
  Administered 2020-10-07: 40 mg via INTRAVENOUS

## 2020-10-07 MED ORDER — ONDANSETRON HCL 4 MG/2ML IJ SOLN
INTRAMUSCULAR | Status: DC | PRN
  Administered 2020-10-07: 4 mg via INTRAVENOUS

## 2020-10-07 MED ORDER — LIDOCAINE HCL (CARDIAC) PF 50 MG/5ML IV SOSY
PREFILLED_SYRINGE | INTRAVENOUS | Status: DC | PRN
  Administered 2020-10-07: 50 mg via INTRAVENOUS

## 2020-10-07 MED ORDER — MIDAZOLAM HCL 5 MG/5ML IJ SOLN
INTRAMUSCULAR | Status: DC | PRN
  Administered 2020-10-07: 2 mg via INTRAVENOUS

## 2020-10-07 MED ORDER — ORAL CARE MOUTH RINSE: 15.0000 mL | Freq: Once | OROMUCOSAL | Status: AC

## 2020-10-07 MED ORDER — GLYCOPYRROLATE PF 0.2 MG/ML IJ SOSY
PREFILLED_SYRINGE | INTRAMUSCULAR | Status: AC
  Filled 2020-10-07: qty 1

## 2020-10-07 MED ORDER — FENTANYL CITRATE (PF) 100 MCG/2ML IJ SOLN
INTRAMUSCULAR | Status: DC | PRN
  Administered 2020-10-07 (×2): 50 ug via INTRAVENOUS

## 2020-10-07 MED ORDER — SODIUM CHLORIDE 0.9 % IR SOLN
Status: DC | PRN
  Administered 2020-10-07: 3000 mL

## 2020-10-07 MED ORDER — ONDANSETRON HCL 4 MG/2ML IJ SOLN: 4.0000 mg | Freq: Once | INTRAMUSCULAR | Status: DC | PRN

## 2020-10-07 MED ORDER — MIDAZOLAM HCL 2 MG/2ML IJ SOLN
INTRAMUSCULAR | Status: AC
  Filled 2020-10-07: qty 2

## 2020-10-07 SURGICAL SUPPLY — 21 items
CATH ROBINSON RED A/P 16FR (CATHETERS) IMPLANT
DEVICE MYOSURE LITE (MISCELLANEOUS) IMPLANT
DEVICE MYOSURE REACH (MISCELLANEOUS) IMPLANT
DILATOR CANAL MILEX (MISCELLANEOUS) IMPLANT
GAUZE 4X4 16PLY ~~LOC~~+RFID DBL (SPONGE) ×4 IMPLANT
GLOVE SURG LTX SZ6.5 (GLOVE) ×2 IMPLANT
GLOVE SURG UNDER POLY LF SZ7 (GLOVE) ×6 IMPLANT
GOWN STRL REUS W/ TWL LRG LVL3 (GOWN DISPOSABLE) ×1 IMPLANT
GOWN STRL REUS W/TWL LRG LVL3 (GOWN DISPOSABLE) ×4 IMPLANT
IV NS IRRIG 3000ML ARTHROMATIC (IV SOLUTION) ×2 IMPLANT
KIT PROCEDURE FLUENT (KITS) ×2 IMPLANT
KIT TURNOVER CYSTO (KITS) ×2 IMPLANT
KIT TURNOVER KIT B (KITS) ×2 IMPLANT
NEEDLE HYPO 18GX1.5 BLUNT FILL (NEEDLE) ×2 IMPLANT
NS IRRIG 1000ML POUR BTL (IV SOLUTION) ×2 IMPLANT
PACK VAGINAL MINOR WOMEN LF (CUSTOM PROCEDURE TRAY) ×2 IMPLANT
PAD ARMBOARD 7.5X6 YLW CONV (MISCELLANEOUS) ×2 IMPLANT
SEAL ROD LENS SCOPE MYOSURE (ABLATOR) ×2 IMPLANT
SYR 30ML LL (SYRINGE) ×2 IMPLANT
TOWEL OR 17X26 4PK STRL BLUE (TOWEL DISPOSABLE) ×2 IMPLANT
UNDERPAD 30X36 HEAVY ABSORB (UNDERPADS AND DIAPERS) ×2 IMPLANT

## 2020-10-07 NOTE — Transfer of Care (Signed)
Immediate Anesthesia Transfer of Care Note  Patient: Amy Parrish  Procedure(s) Performed: HYSTEROSCOPY INTRAUTERINE DEVICE (IUD) REMOVAL (Vagina )  Patient Location: PACU  Anesthesia Type:General  Level of Consciousness: awake  Airway & Oxygen Therapy: Patient Spontanous Breathing  Post-op Assessment: Report given to RN  Post vital signs: Reviewed  Last Vitals:  Vitals Value Taken Time  BP 104/66 10/07/20 1256  Temp    Pulse 62 10/07/20 1257  Resp 14 10/07/20 1257  SpO2 93 % 10/07/20 1257  Vitals shown include unvalidated device data.  Last Pain:  Vitals:   10/07/20 1043  TempSrc: Oral  PainSc: 0-No pain      Patients Stated Pain Goal: 7 (02/58/52 7782)  Complications: No notable events documented.

## 2020-10-07 NOTE — Discharge Instructions (Signed)
HOME INSTRUCTIONS  Please note any unusual or excessive bleeding, pain, swelling. Mild dizziness or drowsiness are normal for about 24 hours after surgery.   Shower when comfortable  Restrictions: No driving for 24 hours or while taking pain medications.  Activity:  You may return to your regular activity.   Diet:  You may return to your regular diet.  Do not eat large meals.  Eat small frequent meals throughout the day.  Continue to drink a good amount of water at least 6-8 glasses of water per day, hydration is very important for the healing process.  Pain Management: Take over the counter tylenol as needed  Alcohol -- Avoid for 24 hours and while taking pain medications.  Nausea: Take sips of ginger ale or soda  Fever -- Call physician if temperature over 101 degrees  Follow up:  If you experience fever (a temperature greater than 100.4), pain unrelieved by pain medication, shortness of breath, swelling of a single leg, or any other symptoms which are concerning to you please the office immediately.

## 2020-10-07 NOTE — Anesthesia Preprocedure Evaluation (Signed)
Anesthesia Evaluation  Patient identified by MRN, date of birth, ID band Patient awake    Reviewed: Allergy & Precautions, NPO status , Patient's Chart, lab work & pertinent test results  Airway Mallampati: II  TM Distance: >3 FB Neck ROM: Full    Dental  (+) Dental Advisory Given, Teeth Intact   Pulmonary neg pulmonary ROS,    Pulmonary exam normal breath sounds clear to auscultation       Cardiovascular Exercise Tolerance: Good Normal cardiovascular exam Rhythm:Regular Rate:Normal     Neuro/Psych negative neurological ROS  negative psych ROS   GI/Hepatic negative GI ROS, Neg liver ROS, (+) neg Cirrhosis      ,   Endo/Other  negative endocrine ROS  Renal/GU negative Renal ROS     Musculoskeletal  (+) Arthritis , Osteoarthritis,    Abdominal   Peds  Hematology  (+) anemia ,   Anesthesia Other Findings   Reproductive/Obstetrics                            Anesthesia Physical Anesthesia Plan  ASA: 2  Anesthesia Plan: General   Post-op Pain Management:    Induction: Intravenous  PONV Risk Score and Plan: 4 or greater and Ondansetron, Dexamethasone and Midazolam  Airway Management Planned: Nasal Cannula and Natural Airway  Additional Equipment:   Intra-op Plan:   Post-operative Plan:   Informed Consent: I have reviewed the patients History and Physical, chart, labs and discussed the procedure including the risks, benefits and alternatives for the proposed anesthesia with the patient or authorized representative who has indicated his/her understanding and acceptance.     Dental advisory given  Plan Discussed with: CRNA and Surgeon  Anesthesia Plan Comments: (Possible GA with airway was discussed.)       Anesthesia Quick Evaluation

## 2020-10-07 NOTE — Interval H&P Note (Signed)
History and Physical Interval Note:  10/07/2020 11:54 AM  Amy Parrish  has presented today for surgery, with the diagnosis of iud migration.  The various methods of treatment have been discussed with the patient and family. After consideration of risks, benefits and other options for treatment, the patient has consented to  Procedure(s): HYSTEROSCOPY (N/A) INTRAUTERINE DEVICE (IUD) REMOVAL (N/A) as a surgical intervention.  The patient's history has been reviewed, patient examined, no change in status, stable for surgery.  I have reviewed the patient's chart and labs.  Questions were answered to the patient's satisfaction.     Annalee Genta

## 2020-10-07 NOTE — OR Nursing (Signed)
Amy Parrish had a procedure at [facility] and cannot return to work before 1500 on 10/08/20.

## 2020-10-07 NOTE — OR Nursing (Signed)
Told patient to call doctors office to have a work excuse filled out for her to be out of work past Wednesday.

## 2020-10-07 NOTE — Anesthesia Postprocedure Evaluation (Signed)
Anesthesia Post Note  Patient: Rula Keniston Amadi  Procedure(s) Performed: HYSTEROSCOPY (Vagina ) INTRAUTERINE DEVICE (IUD) REMOVAL (Vagina )  Patient location during evaluation: PACU Anesthesia Type: General Level of consciousness: awake and alert and oriented Pain management: pain level controlled Vital Signs Assessment: post-procedure vital signs reviewed and stable Respiratory status: spontaneous breathing and respiratory function stable Cardiovascular status: blood pressure returned to baseline and stable Postop Assessment: no apparent nausea or vomiting Anesthetic complications: no   No notable events documented.   Last Vitals:  Vitals:   10/07/20 1315 10/07/20 1336  BP: 102/62 118/63  Pulse: (!) 52   Resp: 16 14  Temp:  36.5 C  SpO2: 96% 98%    Last Pain:  Vitals:   10/07/20 1336  TempSrc: Oral  PainSc: 0-No pain                 Curry Seefeldt C Norton Bivins

## 2020-10-07 NOTE — Op Note (Signed)
Operative Report  PreOp: IUD migration PostOp: same Procedure:  Hysteroscopy, IUD removal Surgeon: Dr. Janyth Pupa Anesthesia: MAC Complications:none EBL: Minimal  Findings: 7cm anteverted uterus, Lippes loop IUD, both ostia visualized  Specimens: none  Indications: retained IUD  Procedure: The patient was taken to the operating room where she underwent MAC anesthesia without difficulty. The patient was placed in a low lithotomy position using Allen stirrups. The patient was examined with the findings as noted above.  She was then prepped and draped in the normal sterile fashion.  A sterile speculum was inserted into the vagina.  A single tooth tenaculum was placed on the anterior lip of the cervix.  The uterus was then sounded to 7cm. The endocervical canal was then serially dilated to allow for passage of the hysteroscope using Hank dilators.  The diagnostic hysteroscope was then inserted without difficulty and noted to have the findings as listed above. Graspers were used to remove the IUD under direct visualization.  The device was removed in its entirety.  Surveillance of the uterine cavity was performed, ostia visualized, no abnormalities noted, no uterine perforation seen.  All instrument were then removed. Hemostasis was observed at the cervical site. The patient was repositioned to the supine position. The patient tolerated the procedure without any complications and taken to recovery in stable condition.   Janyth Pupa, DO Attending [place], [facility] for [employer], [place]

## 2020-10-08 ENCOUNTER — Encounter (HOSPITAL_COMMUNITY): Payer: Self-pay | Admitting: Obstetrics & Gynecology

## 2022-05-17 ENCOUNTER — Encounter (HOSPITAL_COMMUNITY): Payer: Self-pay | Admitting: Family Medicine

## 2022-05-17 ENCOUNTER — Other Ambulatory Visit (HOSPITAL_COMMUNITY): Payer: Self-pay | Admitting: Family Medicine

## 2022-05-18 ENCOUNTER — Other Ambulatory Visit (HOSPITAL_COMMUNITY): Payer: Self-pay | Admitting: Family Medicine

## 2022-05-18 DIAGNOSIS — Z1231 Encounter for screening mammogram for malignant neoplasm of breast: Secondary | ICD-10-CM

## 2022-05-27 ENCOUNTER — Encounter (HOSPITAL_COMMUNITY): Payer: Self-pay

## 2022-05-27 ENCOUNTER — Ambulatory Visit (HOSPITAL_COMMUNITY)
Admission: RE | Admit: 2022-05-27 | Discharge: 2022-05-27 | Disposition: A | Discharge status: Home / Self Care | Payer: BC Managed Care – PPO | Source: Ambulatory Visit | Attending: Family Medicine | Admitting: Family Medicine

## 2022-05-27 DIAGNOSIS — Z1231 Encounter for screening mammogram for malignant neoplasm of breast: Secondary | ICD-10-CM | POA: Diagnosis not present
